# Patient Record
Sex: Female | Born: 1975 | Race: White | Hispanic: No | Marital: Married | Smoking: Former smoker
Health system: Southern US, Community
[De-identification: ages and names within clinical notes are randomized; demographics above are authoritative.]

## PROBLEM LIST (undated history)

## (undated) DIAGNOSIS — B351 Tinea unguium: Secondary | ICD-10-CM

## (undated) HISTORY — PX: OTHER SURGICAL HISTORY: SHX169

## (undated) HISTORY — PX: TUBAL LIGATION: SHX77

---

## 1998-10-30 ENCOUNTER — Other Ambulatory Visit: Admission: RE | Admit: 1998-10-30 | Discharge: 1998-10-30 | Payer: Self-pay | Admitting: Obstetrics and Gynecology

## 2001-09-28 ENCOUNTER — Other Ambulatory Visit: Admission: RE | Admit: 2001-09-28 | Discharge: 2001-09-28 | Payer: Self-pay

## 2003-09-28 ENCOUNTER — Emergency Department (HOSPITAL_COMMUNITY): Admission: EM | Admit: 2003-09-28 | Discharge: 2003-09-28 | Payer: Self-pay | Admitting: Emergency Medicine

## 2005-07-19 ENCOUNTER — Emergency Department (HOSPITAL_COMMUNITY): Admission: EM | Admit: 2005-07-19 | Discharge: 2005-07-19 | Payer: Self-pay | Admitting: Emergency Medicine

## 2012-09-24 ENCOUNTER — Emergency Department (HOSPITAL_COMMUNITY)
Admission: EM | Admit: 2012-09-24 | Discharge: 2012-09-24 | Disposition: A | Discharge status: Home / Self Care | Payer: BC Managed Care – PPO | Attending: Emergency Medicine | Admitting: Emergency Medicine

## 2012-09-24 ENCOUNTER — Encounter (HOSPITAL_COMMUNITY): Payer: Self-pay | Admitting: *Deleted

## 2012-09-24 DIAGNOSIS — R1310 Dysphagia, unspecified: Secondary | ICD-10-CM | POA: Insufficient documentation

## 2012-09-24 DIAGNOSIS — IMO0002 Reserved for concepts with insufficient information to code with codable children: Secondary | ICD-10-CM | POA: Insufficient documentation

## 2012-09-24 DIAGNOSIS — L509 Urticaria, unspecified: Secondary | ICD-10-CM | POA: Insufficient documentation

## 2012-09-24 DIAGNOSIS — Z880 Allergy status to penicillin: Secondary | ICD-10-CM | POA: Insufficient documentation

## 2012-09-24 DIAGNOSIS — R609 Edema, unspecified: Secondary | ICD-10-CM | POA: Insufficient documentation

## 2012-09-24 DIAGNOSIS — Z91040 Latex allergy status: Secondary | ICD-10-CM | POA: Insufficient documentation

## 2012-09-24 DIAGNOSIS — F17200 Nicotine dependence, unspecified, uncomplicated: Secondary | ICD-10-CM | POA: Insufficient documentation

## 2012-09-24 DIAGNOSIS — Z8619 Personal history of other infectious and parasitic diseases: Secondary | ICD-10-CM | POA: Insufficient documentation

## 2012-09-24 DIAGNOSIS — L299 Pruritus, unspecified: Secondary | ICD-10-CM | POA: Insufficient documentation

## 2012-09-24 HISTORY — DX: Tinea unguium: B35.1

## 2012-09-24 MED ORDER — DIPHENHYDRAMINE HCL 50 MG/ML IJ SOLN
25.0000 mg | Freq: Once | INTRAMUSCULAR | Status: AC
  Administered 2012-09-24: 25 mg via INTRAVENOUS
  Filled 2012-09-24: qty 1

## 2012-09-24 MED ORDER — FAMOTIDINE IN NACL 20-0.9 MG/50ML-% IV SOLN
20.0000 mg | Freq: Once | INTRAVENOUS | Status: AC
  Administered 2012-09-24: 20 mg via INTRAVENOUS
  Filled 2012-09-24: qty 50

## 2012-09-24 MED ORDER — EPINEPHRINE 0.3 MG/0.3ML IJ SOAJ: INTRAMUSCULAR | Status: DC

## 2012-09-24 MED ORDER — METHYLPREDNISOLONE SODIUM SUCC 125 MG IJ SOLR
125.0000 mg | Freq: Once | INTRAMUSCULAR | Status: AC
  Administered 2012-09-24: 125 mg via INTRAVENOUS
  Filled 2012-09-24: qty 2

## 2012-09-24 MED ORDER — HYDROXYZINE PAMOATE 25 MG PO CAPS: 25.0000 mg | ORAL_CAPSULE | Freq: Three times a day (TID) | ORAL | Status: DC | PRN

## 2012-09-24 MED ORDER — HYDROXYZINE HCL 25 MG PO TABS: ORAL_TABLET | ORAL | Status: DC

## 2012-09-24 NOTE — ED Notes (Signed)
Patient previously on Lamisil 2 weeks ago, began having problems w/nausea.  Called PMD who told her to take w/meal which helped but then began having whole body rash and swelling of face and feet.  PMD  Given Prednisone IM + PO rx and Cetirizine HCL.  Started on Prednisone PO but that night began having swelling and difficulty swallowing.  Yesterday went to [facility] ER and got IV Solumedrol which improved itching and swelling.  This AM awoke with new rash over arms and some facial swelling. Raised, red, puritic whelps on arms and hands.  No difficulty swallowing today.

## 2012-10-03 NOTE — ED Provider Notes (Signed)
CSN: [id number]     Arrival date & time 09/24/12  0912 History     First MD Initiated Contact with Patient 09/24/12 [phone]     Chief Complaint  Patient presents with  . Allergic Reaction  . Rash   (Consider location/radiation/quality/duration/timing/severity/associated sxs/prior Treatment) Patient is a 37 y.o. female presenting with allergic reaction and rash. The history is provided by the patient.  Allergic Reaction Presenting symptoms: difficulty swallowing, itching, rash and swelling   Presenting symptoms: no difficulty breathing and no wheezing   Severity:  Moderate Prior allergic episodes:  No prior episodes (latex allergies, penicillin allergies) Context: medications   Relieved by:  Nothing (Partial improvement with IV steroids.) Worsened by:  Nothing tried Ineffective treatments:  Antihistamines and steroids Rash Associated symptoms: no chest pain, no cough, no dysuria, no hematuria and no shortness of breath     Past Medical History  Diagnosis Date  . Nail fungus    Past Surgical History  Procedure Laterality Date  . Cesarean section      x 4  . Leap     . Dilation and cutterage    . Tubal ligation     History reviewed. No pertinent family history. History  Substance Use Topics  . Smoking status: Current Every Day Smoker -- 0.50 packs/day  . Smokeless tobacco: Not on file  . Alcohol Use: No   OB History   Grav Para Term Preterm Abortions TAB SAB Ect Mult Living                 Review of Systems  Constitutional: Negative for activity change.       All ROS Neg except as noted in HPI  HENT: Positive for trouble swallowing. Negative for nosebleeds and neck pain.   Eyes: Negative for photophobia and discharge.  Respiratory: Negative for cough, shortness of breath and wheezing.   Cardiovascular: Negative for chest pain and palpitations.  Gastrointestinal: Negative for abdominal pain and blood in stool.  Genitourinary: Negative for dysuria, frequency and  hematuria.  Musculoskeletal: Negative for back pain and arthralgias.  Skin: Positive for itching and rash.  Neurological: Negative for dizziness, seizures and speech difficulty.  Psychiatric/Behavioral: Negative for hallucinations and confusion.    Allergies  Penicillins; Lamisil; Codeine; and Latex  Home Medications   Current Outpatient Rx  Name  Route  Sig  Dispense  Refill  . cetirizine (ZYRTEC) 10 MG tablet   Oral   Take 10 mg by mouth daily.         . phentermine (ADIPEX-P) 37.5 MG tablet   Oral   Take 37.5 mg by mouth daily before breakfast.         . predniSONE (DELTASONE) 10 MG tablet   Oral   Take 10 mg by mouth as directed. Patient has the directions at home.         Marland Kitchen EPINEPHrine (EPI-PEN) 0.3 mg/0.3 mL SOAJ      Use for emergent allergic reactions and go to ED immediately.   1 Device   0   . hydrOXYzine (ATARAX/VISTARIL) 25 MG tablet      1 po qhs prn itching, or q6h  If needed.   20 tablet   0    BP 123/69  Pulse 68  Temp(Src) 98 F (36.7 C) (Oral)  Resp 18  Ht 5\' 3"  (1.6 m)  Wt 170 lb (77.111 kg)  BMI 30.12 kg/m2  SpO2 100%  LMP 07/25/2012 Physical Exam  Nursing note and vitals reviewed.  Constitutional: She is oriented to person, place, and time. She appears well-developed and well-nourished.  Non-toxic appearance.  HENT:  Head: Normocephalic.  Right Ear: Tympanic membrane and external ear normal.  Left Ear: Tympanic membrane and external ear normal.  Eyes: EOM and lids are normal. Pupils are equal, round, and reactive to light.  Neck: Normal range of motion. Neck supple. Carotid bruit is not present.  Cardiovascular: Normal rate, regular rhythm, normal heart sounds, intact distal pulses and normal pulses.   Pulmonary/Chest: Breath sounds normal. No respiratory distress.  Abdominal: Soft. Bowel sounds are normal. There is no tenderness. There is no guarding.  Musculoskeletal: Normal range of motion.  Lymphadenopathy:       Head (right  side): No submandibular adenopathy present.       Head (left side): No submandibular adenopathy present.    She has no cervical adenopathy.  Neurological: She is alert and oriented to person, place, and time. She has normal strength. No cranial nerve deficit or sensory deficit.  Skin: Skin is warm and dry.  Hives of various stages on the arms and hands and few on chest.  Psychiatric: She has a normal mood and affect. Her speech is normal.    ED Course   Procedures (including critical care time)  Labs Reviewed - No data to display No results found. 1. Hives     MDM  *I have reviewed nursing notes, vital signs, and all appropriate lab and imaging results for this patient.* Pt presents to ED with Hives. IV denadryl and solumedrol and pepcid given to the patient. Pt monitored on pulse ox/pulse monitors.  Pt rechecked after medications and found to have some of the hives gone, and some less red and angry. Airway remain patent during entire visit. No wheezes or stridor. Rx for vistaril and hydroxyzine given. Pt to continue the steroids. She will return immediately if any changes or problem.  Kathie Dike, PA-C 10/03/12 2121

## 2012-10-18 NOTE — ED Provider Notes (Signed)
Medical screening examination/treatment/procedure(s) were performed by non-physician practitioner and as supervising physician I was immediately available for consultation/collaboration.  Donnetta Hutching, MD 10/18/12 [phone]

## 2012-12-18 ENCOUNTER — Emergency Department (HOSPITAL_COMMUNITY)
Admission: EM | Admit: 2012-12-18 | Discharge: 2012-12-18 | Disposition: A | Discharge status: Home / Self Care | Payer: BC Managed Care – PPO | Attending: Emergency Medicine | Admitting: Emergency Medicine

## 2012-12-18 ENCOUNTER — Emergency Department (HOSPITAL_COMMUNITY): Payer: BC Managed Care – PPO

## 2012-12-18 ENCOUNTER — Encounter (HOSPITAL_COMMUNITY): Payer: Self-pay | Admitting: Emergency Medicine

## 2012-12-18 DIAGNOSIS — K8020 Calculus of gallbladder without cholecystitis without obstruction: Secondary | ICD-10-CM | POA: Insufficient documentation

## 2012-12-18 DIAGNOSIS — R112 Nausea with vomiting, unspecified: Secondary | ICD-10-CM | POA: Insufficient documentation

## 2012-12-18 DIAGNOSIS — Z91040 Latex allergy status: Secondary | ICD-10-CM | POA: Insufficient documentation

## 2012-12-18 DIAGNOSIS — K8050 Calculus of bile duct without cholangitis or cholecystitis without obstruction: Secondary | ICD-10-CM

## 2012-12-18 DIAGNOSIS — R5381 Other malaise: Secondary | ICD-10-CM | POA: Insufficient documentation

## 2012-12-18 DIAGNOSIS — F17200 Nicotine dependence, unspecified, uncomplicated: Secondary | ICD-10-CM | POA: Insufficient documentation

## 2012-12-18 DIAGNOSIS — Z880 Allergy status to penicillin: Secondary | ICD-10-CM | POA: Insufficient documentation

## 2012-12-18 DIAGNOSIS — Z3202 Encounter for pregnancy test, result negative: Secondary | ICD-10-CM | POA: Insufficient documentation

## 2012-12-18 DIAGNOSIS — Z79899 Other long term (current) drug therapy: Secondary | ICD-10-CM | POA: Insufficient documentation

## 2012-12-18 DIAGNOSIS — R61 Generalized hyperhidrosis: Secondary | ICD-10-CM | POA: Insufficient documentation

## 2012-12-18 DIAGNOSIS — Z8619 Personal history of other infectious and parasitic diseases: Secondary | ICD-10-CM | POA: Insufficient documentation

## 2012-12-18 DIAGNOSIS — R197 Diarrhea, unspecified: Secondary | ICD-10-CM | POA: Insufficient documentation

## 2012-12-18 LAB — CBC
HCT: 39.8 % (ref 36.0–46.0)
Hemoglobin: 13.4 g/dL (ref 12.0–15.0)
MCH: 27.9 pg (ref 26.0–34.0)
MCHC: 33.7 g/dL (ref 30.0–36.0)
MCV: 82.7 fL (ref 78.0–100.0)
Platelets: 290 10*3/uL (ref 150–400)
RBC: 4.81 MIL/uL (ref 3.87–5.11)
RDW: 13.7 % (ref 11.5–15.5)
WBC: 12.6 10*3/uL — ABNORMAL HIGH (ref 4.0–10.5)

## 2012-12-18 LAB — COMPREHENSIVE METABOLIC PANEL
ALT: 253 U/L — ABNORMAL HIGH (ref 0–35)
Alkaline Phosphatase: 121 U/L — ABNORMAL HIGH (ref 39–117)
Calcium: 10.1 mg/dL (ref 8.4–10.5)
GFR calc Af Amer: >90 mL/min (ref >90)
GFR calc non Af Amer: >90 mL/min (ref >90)
Glucose, Bld: 110 mg/dL — ABNORMAL HIGH (ref 70–99)
Total Protein: 7.9 g/dL (ref 6.0–8.3)

## 2012-12-18 LAB — URINE MICROSCOPIC-ADD ON

## 2012-12-18 LAB — LIPASE, BLOOD: Lipase: 33 U/L (ref 11–59)

## 2012-12-18 LAB — URINALYSIS, ROUTINE W REFLEX MICROSCOPIC: Urobilinogen, UA: 4 mg/dL — ABNORMAL HIGH (ref 0.0–1.0)

## 2012-12-18 LAB — POCT PREGNANCY, URINE: Preg Test, Ur: NEGATIVE

## 2012-12-18 MED ORDER — ONDANSETRON 8 MG PO TBDP: 8.0000 mg | ORAL_TABLET | Freq: Three times a day (TID) | ORAL | Status: DC | PRN

## 2012-12-18 MED ORDER — IOHEXOL 300 MG/ML  SOLN
50.0000 mL | Freq: Once | INTRAMUSCULAR | Status: AC | PRN
  Administered 2012-12-18: 50 mL via ORAL

## 2012-12-18 MED ORDER — ONDANSETRON HCL 4 MG/2ML IJ SOLN
4.0000 mg | Freq: Once | INTRAMUSCULAR | Status: AC
  Administered 2012-12-18: 4 mg via INTRAVENOUS
  Filled 2012-12-18: qty 2

## 2012-12-18 MED ORDER — IOHEXOL 300 MG/ML  SOLN
100.0000 mL | Freq: Once | INTRAMUSCULAR | Status: AC | PRN
  Administered 2012-12-18: 100 mL via INTRAVENOUS

## 2012-12-18 MED ORDER — SODIUM CHLORIDE 0.9 % IV BOLUS (SEPSIS)
1000.0000 mL | Freq: Once | INTRAVENOUS | Status: AC
  Administered 2012-12-18: 1000 mL via INTRAVENOUS

## 2012-12-18 NOTE — ED Provider Notes (Signed)
CSN: [id number]     Arrival date & time 12/18/12  1750 History   First MD Initiated Contact with Patient 12/18/12 1943     Chief Complaint  Patient presents with  . Abdominal Pain   (Consider location/radiation/quality/duration/timing/severity/associated sxs/prior Treatment) Patient is a 37 y.o. female presenting with abdominal pain. The history is provided by the patient.  Abdominal Pain Associated symptoms: chills, diarrhea, fatigue, nausea and vomiting   Associated symptoms: no chest pain, no dysuria and no shortness of breath    patient has had upper abdominal pain since last night. States she's had nausea vomiting diarrhea. She's had a decreased appetite. She states she has vomited what looked like poop. No fevers. She states she's had chills. She states she thinks it is her gallbladder. She states the pain is radiated up to her chest.  Past Medical History  Diagnosis Date  . Nail fungus    Past Surgical History  Procedure Laterality Date  . Cesarean section      x 4  . Leap     . Dilation and cutterage    . Tubal ligation     No family history on file. History  Substance Use Topics  . Smoking status: Current Every Day Smoker -- 0.50 packs/day  . Smokeless tobacco: Not on file  . Alcohol Use: No   OB History   Grav Para Term Preterm Abortions TAB SAB Ect Mult Living                 Review of Systems  Constitutional: Positive for chills, diaphoresis, appetite change and fatigue. Negative for activity change.  Eyes: Negative for pain.  Respiratory: Negative for chest tightness and shortness of breath.   Cardiovascular: Negative for chest pain and leg swelling.  Gastrointestinal: Positive for nausea, vomiting, abdominal pain and diarrhea.  Genitourinary: Negative for dysuria and flank pain.  Musculoskeletal: Negative for back pain and neck stiffness.  Skin: Negative for rash.  Neurological: Negative for weakness, numbness and headaches.  Psychiatric/Behavioral:  Negative for behavioral problems.    Allergies  Penicillins; Lamisil; Codeine; and Latex  Home Medications   Current Outpatient Rx  Name  Route  Sig  Dispense  Refill  . EPINEPHrine (EPI-PEN) 0.3 mg/0.3 mL SOAJ      Use for emergent allergic reactions and go to ED immediately.   1 Device   0   . ondansetron (ZOFRAN-ODT) 8 MG disintegrating tablet   Oral   Take 1 tablet (8 mg total) by mouth every 8 (eight) hours as needed for nausea.   10 tablet   0   . phentermine (ADIPEX-P) 37.5 MG tablet   Oral   Take 37.5 mg by mouth daily before breakfast.          BP 105/57  Pulse 81  Temp(Src) 98.5 F (36.9 C) (Oral)  Resp 22  Ht 5' 3.5" (1.613 m)  Wt 169 lb (76.658 kg)  BMI 29.46 kg/m2  SpO2 100% Physical Exam  Nursing note and vitals reviewed. Constitutional: She is oriented to person, place, and time. She appears well-developed and well-nourished.  HENT:  Head: Normocephalic and atraumatic.  Eyes: EOM are normal. Pupils are equal, round, and reactive to light.  Neck: Normal range of motion. Neck supple.  Cardiovascular: Normal rate, regular rhythm and normal heart sounds.   No murmur heard. Pulmonary/Chest: Effort normal and breath sounds normal. No respiratory distress. She has no wheezes. She has no rales.  Abdominal: Soft. Bowel sounds are normal. She  exhibits no distension. There is tenderness. There is no rebound and no guarding.  Mild upper abdominal tenderness without rebound or guarding.  Musculoskeletal: Normal range of motion.  Neurological: She is alert and oriented to person, place, and time. No cranial nerve deficit.  Skin: Skin is warm and dry.  Psychiatric: She has a normal mood and affect. Her speech is normal.    ED Course  Procedures (including critical care time) Labs Review Labs Reviewed  COMPREHENSIVE METABOLIC PANEL - Abnormal; Notable for the following:    Glucose, Bld 110 (*)    AST 308 (*)    ALT 253 (*)    Alkaline Phosphatase 121 (*)     Total Bilirubin 2.5 (*)    All other components within normal limits  CBC - Abnormal; Notable for the following:    WBC 12.6 (*)    All other components within normal limits  URINALYSIS, ROUTINE W REFLEX MICROSCOPIC - Abnormal; Notable for the following:    Color, Urine ORANGE (*)    APPearance CLOUDY (*)    Specific Gravity, Urine >1.030 (*)    Hgb urine dipstick SMALL (*)    Bilirubin Urine LARGE (*)    Ketones, ur TRACE (*)    Protein, ur 100 (*)    Urobilinogen, UA 4.0 (*)    Nitrite POSITIVE (*)    Leukocytes, UA TRACE (*)    All other components within normal limits  URINE MICROSCOPIC-ADD ON - Abnormal; Notable for the following:    Squamous Epithelial / LPF MANY (*)    Bacteria, UA MANY (*)    All other components within normal limits  URINE CULTURE  LIPASE, BLOOD  POCT PREGNANCY, URINE   Imaging Review Ct Abdomen Pelvis W Contrast  12/18/2012   *RADIOLOGY REPORT*  Clinical Data: Upper abdominal pain  CT ABDOMEN AND PELVIS WITH CONTRAST  Technique:  Multidetector CT imaging of the abdomen and pelvis was performed following the standard protocol during bolus administration of intravenous contrast.  Contrast: OMNIPAQUE IOHEXOL 300 MG/ML  SOLN, 50mL OMNIPAQUE IOHEXOL 300 MG/ML  SOLN  Comparison: None.  Findings: The visualized lung bases are clear.  The liver demonstrates a normal contrast enhanced appearance. Single calcified gallstone is present within the gallbladder lumen (series 2, image 27).  There is no CT evidence of acute cholecystitis.  The spleen, adrenal glands, and pancreas demonstrate normal contrast enhanced appearance.  The kidneys are symmetric in size with symmetric enhancement.  No nephrolithiasis, hydronephrosis, or focal renal masses identified.  The stomach is within normal limits.  No hiatal hernia.  No evidence of obstruction.  Appendix is well visualized and is normal in caliber without associate inflammatory changes to suggest acute appendicitis.   No abnormal wall thickening, enhancement, or perienteric fat stranding is seen about the bowels.  Fat containing periumbilical hernia is present.  The bladder is within normal limits.  A 1.3 cm hyperdense lesions seen within the left uterine body is present (series 2, image 76), most consistent with a fibroid. Uterus is otherwise unremarkable.  A 1.9 x 1.9 cm corpus luteal cyst is present within the right ovary.  There is a small amount of associated free fluid within the right pelvic cul-de-sac.  Left ovary is within normal limits.  No free air is identified.  No pathologically enlarged intra- abdominal pelvic lymph nodes.  The osseous structures are within normal limits.  IMPRESSION: 1.  Cholelithiasis without CT evidence of acute cholecystitis. 2.  1.3 cm hyperdense lesion within  the uterine body, likely a fibroid. This could be further evaluated with dedicated pelvic ultrasound and / or MRI as clinically indicated. 3.  Right corpus luteal cyst with associated small volume free fluid within the right pelvic cul-de-sac.  4.  Normal appendix. 5.  Small fat containing periumbilical hernia.   Original Report Authenticated By: Rise Mu, M.D.    EKG Interpretation   None       MDM   1. Biliary colic    Patient with nausea vomiting diarrhea. No dysuria. Laboratory was elevated LFTs. CT scan showed cholelithiasis without cholecystitis. Patient did have previous severe pain. It may have been an obstructing stone at that time said that since freed itself. Patient feels better. She will be discharged home. Urine is nonspecific. Culture has been sent. We'll follow with general surgery and PCP. Will likely need LFTs rechecked within a week    Juliet Rude. Rubin Payor, MD 12/18/12 2318

## 2012-12-18 NOTE — ED Notes (Signed)
Pt reporting generalized upper abdominal pain in epigastric area.  Reporting pain, chills, nausea and vomiting starting last night. Reports pain somewhat improved at present time, due to taking a percocet prior to arrival.

## 2012-12-18 NOTE — ED Notes (Signed)
Pt resting quietly.  Respirations regular, even and unlabored.  No distress noted.  

## 2012-12-18 NOTE — ED Notes (Signed)
Vomiting and diarrhea last night. Upper abdomen pain last night also. Last vomited at 0500. Pain continues to back and upper abdomen. NAD

## 2012-12-19 ENCOUNTER — Encounter (HOSPITAL_COMMUNITY): Payer: Self-pay | Admitting: Emergency Medicine

## 2012-12-19 ENCOUNTER — Inpatient Hospital Stay (HOSPITAL_COMMUNITY)
Admission: EM | Admit: 2012-12-19 | Discharge: 2012-12-21 | DRG: 494 | Disposition: A | Discharge status: Home / Self Care | Payer: BC Managed Care – PPO | Attending: General Surgery | Admitting: General Surgery

## 2012-12-19 DIAGNOSIS — K8010 Calculus of gallbladder with chronic cholecystitis without obstruction: Principal | ICD-10-CM | POA: Diagnosis present

## 2012-12-19 DIAGNOSIS — Z880 Allergy status to penicillin: Secondary | ICD-10-CM

## 2012-12-19 DIAGNOSIS — F17200 Nicotine dependence, unspecified, uncomplicated: Secondary | ICD-10-CM | POA: Diagnosis present

## 2012-12-19 DIAGNOSIS — Z885 Allergy status to narcotic agent status: Secondary | ICD-10-CM

## 2012-12-19 DIAGNOSIS — Z91040 Latex allergy status: Secondary | ICD-10-CM

## 2012-12-19 DIAGNOSIS — K8020 Calculus of gallbladder without cholecystitis without obstruction: Secondary | ICD-10-CM

## 2012-12-19 DIAGNOSIS — R17 Unspecified jaundice: Secondary | ICD-10-CM | POA: Diagnosis present

## 2012-12-19 LAB — URINALYSIS, ROUTINE W REFLEX MICROSCOPIC
Glucose, UA: NEGATIVE mg/dL
Ketones, ur: NEGATIVE mg/dL
Specific Gravity, Urine: 1.015 (ref 1.005–1.030)
Urobilinogen, UA: 0.2 mg/dL (ref 0.0–1.0)
pH: 7 (ref 5.0–8.0)

## 2012-12-19 LAB — URINE MICROSCOPIC-ADD ON

## 2012-12-19 LAB — COMPREHENSIVE METABOLIC PANEL
Albumin: 4 g/dL (ref 3.5–5.2)
CO2: 26 mEq/L (ref 19–32)
Creatinine, Ser: 0.76 mg/dL (ref 0.50–1.10)
Total Protein: 6.5 g/dL (ref 6.0–8.3)

## 2012-12-19 LAB — CBC WITH DIFFERENTIAL/PLATELET
Basophils Absolute: 0 10*3/uL (ref 0.0–0.1)
Basophils Relative: 0 % (ref 0–1)
Hemoglobin: 11.9 g/dL — ABNORMAL LOW (ref 12.0–15.0)
MCH: 27.5 pg (ref 26.0–34.0)
Monocytes Relative: 7 % (ref 3–12)
Neutro Abs: 5.8 10*3/uL (ref 1.7–7.7)
Platelets: 228 10*3/uL (ref 150–400)
RBC: 4.32 MIL/uL (ref 3.87–5.11)
RDW: 13.7 % (ref 11.5–15.5)

## 2012-12-19 LAB — BILIRUBIN, DIRECT: Bilirubin, Direct: 1.9 mg/dL — ABNORMAL HIGH (ref 0.0–0.3)

## 2012-12-19 MED ORDER — HYDROMORPHONE HCL PF 1 MG/ML IJ SOLN: 1.0000 mg | INTRAMUSCULAR | Status: DC | PRN

## 2012-12-19 MED ORDER — DIPHENHYDRAMINE HCL 50 MG/ML IJ SOLN
12.5000 mg | Freq: Four times a day (QID) | INTRAMUSCULAR | Status: DC | PRN
  Administered 2012-12-19: 25 mg via INTRAVENOUS
  Administered 2012-12-19: 12.5 mg via INTRAVENOUS
  Filled 2012-12-19 (×2): qty 1

## 2012-12-19 MED ORDER — DIPHENHYDRAMINE HCL 12.5 MG/5ML PO ELIX
12.5000 mg | ORAL_SOLUTION | Freq: Four times a day (QID) | ORAL | Status: DC | PRN
  Administered 2012-12-20: 25 mg via ORAL
  Administered 2012-12-21 (×2): 12.5 mg via ORAL
  Filled 2012-12-19: qty 5
  Filled 2012-12-19: qty 10
  Filled 2012-12-19: qty 5

## 2012-12-19 MED ORDER — ONDANSETRON HCL 4 MG/2ML IJ SOLN
4.0000 mg | Freq: Four times a day (QID) | INTRAMUSCULAR | Status: DC | PRN
  Administered 2012-12-19: 4 mg via INTRAVENOUS
  Filled 2012-12-19: qty 2

## 2012-12-19 MED ORDER — OXYCODONE HCL 5 MG PO TABS
5.0000 mg | ORAL_TABLET | ORAL | Status: DC | PRN
  Administered 2012-12-21: 5 mg via ORAL
  Filled 2012-12-19: qty 1

## 2012-12-19 MED ORDER — CLINDAMYCIN PHOSPHATE 600 MG/50ML IV SOLN
600.0000 mg | Freq: Three times a day (TID) | INTRAVENOUS | Status: DC
  Administered 2012-12-19 – 2012-12-21 (×6): 600 mg via INTRAVENOUS
  Filled 2012-12-19 (×10): qty 50

## 2012-12-19 MED ORDER — FENTANYL CITRATE 0.05 MG/ML IJ SOLN
INTRAMUSCULAR | Status: AC
  Filled 2012-12-19: qty 2

## 2012-12-19 MED ORDER — HYDROMORPHONE HCL PF 1 MG/ML IJ SOLN
1.0000 mg | Freq: Once | INTRAMUSCULAR | Status: AC
  Administered 2012-12-19: 1 mg via INTRAVENOUS
  Filled 2012-12-19: qty 1

## 2012-12-19 MED ORDER — ACETAMINOPHEN 325 MG PO TABS: 650.0000 mg | ORAL_TABLET | Freq: Four times a day (QID) | ORAL | Status: DC | PRN

## 2012-12-19 MED ORDER — PNEUMOCOCCAL VAC POLYVALENT 25 MCG/0.5ML IJ INJ
0.5000 mL | INJECTION | INTRAMUSCULAR | Status: AC
  Administered 2012-12-20: 0.5 mL via INTRAMUSCULAR
  Filled 2012-12-19: qty 0.5

## 2012-12-19 MED ORDER — NICOTINE 14 MG/24HR TD PT24
14.0000 mg | MEDICATED_PATCH | Freq: Every day | TRANSDERMAL | Status: DC
  Administered 2012-12-19: 14 mg via TRANSDERMAL
  Filled 2012-12-19 (×2): qty 1

## 2012-12-19 MED ORDER — KCL IN DEXTROSE-NACL 20-5-0.45 MEQ/L-%-% IV SOLN
INTRAVENOUS | Status: DC
  Administered 2012-12-19 – 2012-12-20 (×4): via INTRAVENOUS

## 2012-12-19 MED ORDER — ENOXAPARIN SODIUM 40 MG/0.4ML ~~LOC~~ SOLN
40.0000 mg | SUBCUTANEOUS | Status: DC
  Administered 2012-12-19 – 2012-12-20 (×2): 40 mg via SUBCUTANEOUS
  Filled 2012-12-19 (×2): qty 0.4

## 2012-12-19 MED ORDER — ONDANSETRON HCL 4 MG/2ML IJ SOLN
INTRAMUSCULAR | Status: AC
  Filled 2012-12-19: qty 2

## 2012-12-19 MED ORDER — ACETAMINOPHEN 650 MG RE SUPP: 650.0000 mg | Freq: Four times a day (QID) | RECTAL | Status: DC | PRN

## 2012-12-19 MED ORDER — INFLUENZA VAC SPLIT QUAD 0.5 ML IM SUSP
0.5000 mL | INTRAMUSCULAR | Status: AC
  Administered 2012-12-20: 0.5 mL via INTRAMUSCULAR
  Filled 2012-12-19: qty 0.5

## 2012-12-19 NOTE — ED Notes (Signed)
MD at bedside. Jenkins 

## 2012-12-19 NOTE — ED Provider Notes (Signed)
CSN: [id number]     Arrival date & time 12/19/12  0804 History  This chart was scribed for Karen Baton, MD by Caryn Bee, ED Scribe. This patient was seen in room [facility]10[facility] and the patient's care was started 8:19 AM.    Chief Complaint  Patient presents with  . Abdominal Pain   The history is provided by the patient. No language interpreter was used.   HPI Comments: Karen Gillespie is a 37 y.o. female who presents to the Emergency Department complaining of gradual onset, constant severe sharp/stabbing RUQ abdominal pain that began yesterday. She was seen in the ED yesterday by Dr. Benjiman Core and was diagnosed with gallstones. Pt rates her pain as a 10/10. She also states that the pain radiates to her back. Pt states that last night after discharge the pain improved, but has since worsened. Pt states that she took Vicodin at about 1:00 AM with no relief. Pt has not had a BM since yesterday. She states she has urinated today but not as frequently as yesterday Pt denies fever, chest pain, SOB currently, but did have symptoms yesterday. Pt has been on phentermine for 2 weeks. Pt is allergic to codeine, penicillin, Lamisil, and latex.     Past Medical History  Diagnosis Date  . Nail fungus     shingles   Past Surgical History  Procedure Laterality Date  . Cesarean section      x 4  . Leap     . Dilation and cutterage    . Tubal ligation     History reviewed. No pertinent family history. History  Substance Use Topics  . Smoking status: Current Every Day Smoker -- 0.50 packs/day  . Smokeless tobacco: Not on file  . Alcohol Use: No   OB History   Grav Para Term Preterm Abortions TAB SAB Ect Mult Living                 Review of Systems  Constitutional: Negative for fever.  Respiratory: Negative for cough, chest tightness and shortness of breath.   Cardiovascular: Negative for chest pain.  Gastrointestinal: Positive for abdominal pain (RUQ). Negative for nausea  and vomiting.  Genitourinary: Negative for dysuria.  Musculoskeletal: Positive for back pain (Radiating from abdominal pain).  Skin: Negative for wound.  Neurological: Negative for headaches.  Psychiatric/Behavioral: Negative for confusion.  All other systems reviewed and are negative.    Allergies  Penicillins; Lamisil; Codeine; and Latex  Home Medications   No current outpatient prescriptions on file. Triage Vitals: BP 131/81  Pulse 76  Temp(Src) 98 F (36.7 C) (Oral)  Resp 21  SpO2 99%  Physical Exam  Nursing note and vitals reviewed. Constitutional: She is oriented to person, place, and time. She appears well-developed and well-nourished.  Distressed, nontoxic. Tearful.   HENT:  Head: Normocephalic and atraumatic.  Eyes: Pupils are equal, round, and reactive to light.  Neck: Neck supple.  Cardiovascular: Normal rate, regular rhythm and normal heart sounds.   Pulmonary/Chest: Effort normal and breath sounds normal. No respiratory distress. She has no wheezes.  Abdominal: Soft. Bowel sounds are normal. She exhibits no distension. There is tenderness. There is no rebound and no guarding.  Mild tenderness to palpation of the RUQ.+ Murphy's signs.   Neurological: She is alert and oriented to person, place, and time.  Skin: Skin is warm and dry.  Psychiatric: She has a normal mood and affect.  Anxious.    ED Course  Procedures (including  critical care time) DIAGNOSTIC STUDIES: Oxygen Saturation is 99% on room air, normal by my interpretation.    COORDINATION OF CARE: 8:23 AM-Discussed treatment plan which includes blood work, hydromorphone, fentanyl, and zofran with pt at bedside and pt agreed to plan.   Medications  enoxaparin (LOVENOX) injection 40 mg (40 mg Subcutaneous Given 12/19/12 1352)  dextrose 5 % and 0.45 % NaCl with KCl 20 mEq/L infusion ( Intravenous New Bag/Given 12/19/12 1326)  acetaminophen (TYLENOL) tablet 650 mg (not administered)    Or   acetaminophen (TYLENOL) suppository 650 mg (not administered)  oxyCODONE (Oxy IR/ROXICODONE) immediate release tablet 5 mg (not administered)  HYDROmorphone (DILAUDID) injection 1 mg (not administered)  diphenhydrAMINE (BENADRYL) injection 12.5-25 mg (12.5 mg Intravenous Given 12/19/12 1605)    Or  diphenhydrAMINE (BENADRYL) 12.5 MG/5ML elixir 12.5-25 mg ( Oral See Alternative 12/19/12 1605)  ondansetron (ZOFRAN) injection 4 mg (4 mg Intravenous Given 12/19/12 1605)  nicotine (NICODERM CQ - dosed in mg/24 hours) patch 14 mg (14 mg Transdermal Patch Applied 12/19/12 1315)  clindamycin (CLEOCIN) IVPB 600 mg (600 mg Intravenous Given 12/19/12 1351)  influenza vac split quadrivalent PF (FLUARIX) injection 0.5 mL (not administered)  pneumococcal 23 valent vaccine (PNU-IMMUNE) injection 0.5 mL (not administered)  HYDROmorphone (DILAUDID) injection 1 mg (1 mg Intravenous Given 12/19/12 0842)    Labs Review Labs Reviewed  CBC WITH DIFFERENTIAL - Abnormal; Notable for the following:    Hemoglobin 11.9 (*)    HCT 35.9 (*)    All other components within normal limits  COMPREHENSIVE METABOLIC PANEL - Abnormal; Notable for the following:    Glucose, Bld 115 (*)    AST 138 (*)    ALT 189 (*)    Alkaline Phosphatase 123 (*)    Total Bilirubin 4.1 (*)    All other components within normal limits  URINALYSIS, ROUTINE W REFLEX MICROSCOPIC - Abnormal; Notable for the following:    Hgb urine dipstick TRACE (*)    Bilirubin Urine SMALL (*)    All other components within normal limits  BILIRUBIN, DIRECT - Abnormal; Notable for the following:    Bilirubin, Direct 1.9 (*)    All other components within normal limits  URINE MICROSCOPIC-ADD ON  BASIC METABOLIC PANEL  MAGNESIUM  PHOSPHORUS  CBC  HEPATIC FUNCTION PANEL   Imaging Review Ct Abdomen Pelvis W Contrast  12/18/2012   *RADIOLOGY REPORT*  Clinical Data: Upper abdominal pain  CT ABDOMEN AND PELVIS WITH CONTRAST  Technique:  Multidetector CT  imaging of the abdomen and pelvis was performed following the standard protocol during bolus administration of intravenous contrast.  Contrast: OMNIPAQUE IOHEXOL 300 MG/ML  SOLN, 50mL OMNIPAQUE IOHEXOL 300 MG/ML  SOLN  Comparison: None.  Findings: The visualized lung bases are clear.  The liver demonstrates a normal contrast enhanced appearance. Single calcified gallstone is present within the gallbladder lumen (series 2, image 27).  There is no CT evidence of acute cholecystitis.  The spleen, adrenal glands, and pancreas demonstrate normal contrast enhanced appearance.  The kidneys are symmetric in size with symmetric enhancement.  No nephrolithiasis, hydronephrosis, or focal renal masses identified.  The stomach is within normal limits.  No hiatal hernia.  No evidence of obstruction.  Appendix is well visualized and is normal in caliber without associate inflammatory changes to suggest acute appendicitis.  No abnormal wall thickening, enhancement, or perienteric fat stranding is seen about the bowels.  Fat containing periumbilical hernia is present.  The bladder is within normal limits.  A  1.3 cm hyperdense lesions seen within the left uterine body is present (series 2, image 76), most consistent with a fibroid. Uterus is otherwise unremarkable.  A 1.9 x 1.9 cm corpus luteal cyst is present within the right ovary.  There is a small amount of associated free fluid within the right pelvic cul-de-sac.  Left ovary is within normal limits.  No free air is identified.  No pathologically enlarged intra- abdominal pelvic lymph nodes.  The osseous structures are within normal limits.  IMPRESSION: 1.  Cholelithiasis without CT evidence of acute cholecystitis. 2.  1.3 cm hyperdense lesion within the uterine body, likely a fibroid. This could be further evaluated with dedicated pelvic ultrasound and / or MRI as clinically indicated. 3.  Right corpus luteal cyst with associated small volume free fluid within the right  pelvic cul-de-sac.  4.  Normal appendix. 5.  Small fat containing periumbilical hernia.   Original Report Authenticated By: Rise Mu, M.D.    EKG Interpretation     Ventricular Rate:    PR Interval:    QRS Duration:   QT Interval:    QTC Calculation:   R Axis:     Text Interpretation:              MDM   1. Cholelithiasis    Patient presents with RUQ pain.  Evaluated last night and found to have gallstones on CT.  Worsened pain at home.  Patient given dilaudid and zofran.  Repeat labwork shows normalization of all LFTs except bilirubin which is up to 4.  Patient's pain is currently controlled.  Spoke with surgery who will admit patient for concern of CBD stone and possible need for ERCP.   I personally performed the services described in this documentation, which was scribed in my presence. The recorded information has been reviewed and is accurate.   Karen Baton, MD 12/19/12 [phone]

## 2012-12-19 NOTE — H&P (Signed)
Karen Gillespie is an 37 y.o. female.   Chief Complaint: Abdominal pain HPI: Patient is a 37 year old otherwise healthy white female who presented to the emergency room yesterday evening with a three-day history of right upper quadrant abdominal pain and nausea. Her liver enzyme tests were noted to be elevated and she did have a CT scan of the abdomen which revealed cholelithiasis. She was discharged home on Vicodin. Her pain recurred while at home, though she came back to the emergency room this morning. Repeat liver enzyme test revealed her total bilirubin to be elevated at 4. Her other liver enzyme tests were starting to normalize. Her lipase was within normal limits. She states that this is her first episode. She denies any fever, chills, or jaundice, though her skin has become itchy. Her urine is orange.  Past Medical History  Diagnosis Date  . Nail fungus     Past Surgical History  Procedure Laterality Date  . Cesarean section      x 4  . Leap     . Dilation and cutterage    . Tubal ligation      History reviewed. No pertinent family history. Social History:  reports that she has been smoking.  She does not have any smokeless tobacco history on file. She reports that she does not drink alcohol or use illicit drugs.  Allergies:  Allergies  Allergen Reactions  . Penicillins Shortness Of Breath, Swelling and Rash  . Lamisil [Terbinafine Hcl] Nausea Only  . Codeine Swelling and Rash  . Latex Swelling and Rash     (Not in a hospital admission)  Results for orders placed during the hospital encounter of 12/19/12 (from the past 48 hour(s))  BILIRUBIN, DIRECT     Status: Abnormal   Collection Time    12/19/12  8:31 AM      Result Value Range   Bilirubin, Direct 1.9 (*) 0.0 - 0.3 mg/dL  CBC WITH DIFFERENTIAL     Status: Abnormal   Collection Time    12/19/12  8:34 AM      Result Value Range   WBC 8.0  4.0 - 10.5 K/uL   RBC 4.32  3.87 - 5.11 MIL/uL   Hemoglobin 11.9 (*)  12.0 - 15.0 g/dL   HCT 09.6 (*) 04.5 - 40.9 %   MCV 83.1  78.0 - 100.0 fL   MCH 27.5  26.0 - 34.0 pg   MCHC 33.1  30.0 - 36.0 g/dL   RDW 81.1  91.4 - 78.2 %   Platelets 228  150 - 400 K/uL   Neutrophils Relative % 73  43 - 77 %   Neutro Abs 5.8  1.7 - 7.7 K/uL   Lymphocytes Relative 18  12 - 46 %   Lymphs Abs 1.5  0.7 - 4.0 K/uL   Monocytes Relative 7  3 - 12 %   Monocytes Absolute 0.6  0.1 - 1.0 K/uL   Eosinophils Relative 2  0 - 5 %   Eosinophils Absolute 0.1  0.0 - 0.7 K/uL   Basophils Relative 0  0 - 1 %   Basophils Absolute 0.0  0.0 - 0.1 K/uL  COMPREHENSIVE METABOLIC PANEL     Status: Abnormal   Collection Time    12/19/12  8:34 AM      Result Value Range   Sodium 135  135 - 145 mEq/L   Potassium 3.6  3.5 - 5.1 mEq/L   Chloride 100  96 - 112 mEq/L  CO2 26  19 - 32 mEq/L   Glucose, Bld 115 (*) 70 - 99 mg/dL   BUN 8  6 - 23 mg/dL   Creatinine, Ser 9.14  0.50 - 1.10 mg/dL   Calcium 8.8  8.4 - 78.2 mg/dL   Total Protein 6.5  6.0 - 8.3 g/dL   Albumin 4.0  3.5 - 5.2 g/dL   AST 956 (*) 0 - 37 U/L   ALT 189 (*) 0 - 35 U/L   Alkaline Phosphatase 123 (*) 39 - 117 U/L   Total Bilirubin 4.1 (*) 0.3 - 1.2 mg/dL   GFR calc non Af Amer >90  >90 mL/min   GFR calc Af Amer >90  >90 mL/min   Comment: (NOTE)     The eGFR has been calculated using the CKD EPI equation.     This calculation has not been validated in all clinical situations.     eGFR's persistently <90 mL/min signify possible Chronic Kidney     Disease.  URINALYSIS, ROUTINE W REFLEX MICROSCOPIC     Status: Abnormal   Collection Time    12/19/12 10:28 AM      Result Value Range   Color, Urine YELLOW  YELLOW   APPearance CLEAR  CLEAR   Specific Gravity, Urine 1.015  1.005 - 1.030   pH 7.0  5.0 - 8.0   Glucose, UA NEGATIVE  NEGATIVE mg/dL   Hgb urine dipstick TRACE (*) NEGATIVE   Bilirubin Urine SMALL (*) NEGATIVE   Ketones, ur NEGATIVE  NEGATIVE mg/dL   Protein, ur NEGATIVE  NEGATIVE mg/dL   Urobilinogen, UA 0.2   0.0 - 1.0 mg/dL   Nitrite NEGATIVE  NEGATIVE   Leukocytes, UA NEGATIVE  NEGATIVE  URINE MICROSCOPIC-ADD ON     Status: None   Collection Time    12/19/12 10:28 AM      Result Value Range   Squamous Epithelial / LPF RARE  RARE   WBC, UA 0-2  <3 WBC/hpf   RBC / HPF 3-6  <3 RBC/hpf   Bacteria, UA RARE  RARE   Ct Abdomen Pelvis W Contrast  12/18/2012   *RADIOLOGY REPORT*  Clinical Data: Upper abdominal pain  CT ABDOMEN AND PELVIS WITH CONTRAST  Technique:  Multidetector CT imaging of the abdomen and pelvis was performed following the standard protocol during bolus administration of intravenous contrast.  Contrast: OMNIPAQUE IOHEXOL 300 MG/ML  SOLN, 50mL OMNIPAQUE IOHEXOL 300 MG/ML  SOLN  Comparison: None.  Findings: The visualized lung bases are clear.  The liver demonstrates a normal contrast enhanced appearance. Single calcified gallstone is present within the gallbladder lumen (series 2, image 27).  There is no CT evidence of acute cholecystitis.  The spleen, adrenal glands, and pancreas demonstrate normal contrast enhanced appearance.  The kidneys are symmetric in size with symmetric enhancement.  No nephrolithiasis, hydronephrosis, or focal renal masses identified.  The stomach is within normal limits.  No hiatal hernia.  No evidence of obstruction.  Appendix is well visualized and is normal in caliber without associate inflammatory changes to suggest acute appendicitis.  No abnormal wall thickening, enhancement, or perienteric fat stranding is seen about the bowels.  Fat containing periumbilical hernia is present.  The bladder is within normal limits.  A 1.3 cm hyperdense lesions seen within the left uterine body is present (series 2, image 76), most consistent with a fibroid. Uterus is otherwise unremarkable.  A 1.9 x 1.9 cm corpus luteal cyst is present within the right ovary.  There is a small amount of associated free fluid within the right pelvic cul-de-sac.  Left ovary is within normal  limits.  No free air is identified.  No pathologically enlarged intra- abdominal pelvic lymph nodes.  The osseous structures are within normal limits.  IMPRESSION: 1.  Cholelithiasis without CT evidence of acute cholecystitis. 2.  1.3 cm hyperdense lesion within the uterine body, likely a fibroid. This could be further evaluated with dedicated pelvic ultrasound and / or MRI as clinically indicated. 3.  Right corpus luteal cyst with associated small volume free fluid within the right pelvic cul-de-sac.  4.  Normal appendix. 5.  Small fat containing periumbilical hernia.   Original Report Authenticated By: Rise Mu, M.D.    Review of Systems  Constitutional: Positive for malaise/fatigue.  HENT: Negative.   Eyes: Negative.   Respiratory: Negative.   Cardiovascular: Negative.   Gastrointestinal: Positive for nausea and abdominal pain.  Genitourinary: Negative.   Musculoskeletal: Negative.   Skin: Positive for itching.  Neurological: Negative.     Blood pressure 131/81, pulse 76, temperature 98 F (36.7 C), temperature source Oral, resp. rate 21, SpO2 99.00%. Physical Exam  Constitutional: She is oriented to person, place, and time. She appears well-developed and well-nourished.  HENT:  Head: Normocephalic and atraumatic.  Neck: Normal range of motion. Neck supple.  Cardiovascular: Normal rate, regular rhythm and normal heart sounds.   Respiratory: Effort normal and breath sounds normal.  GI: Soft. Bowel sounds are normal. She exhibits no distension. There is no rebound and no guarding.  Mild tenderness in the right upper quadrant to deep palpation.  Neurological: She is alert and oriented to person, place, and time.  Skin: Skin is warm and dry.     Assessment/Plan Impression: Cholecystitis, cholelithiasis, jaundice. She does have cholelithiasis, though she may have a common bile duct stone or has recently passed a common bile duct stone. No clinical evidence of  pancreatitis. Plan: Patient admitted to the hospital for control of her pain and nausea. She was started on clindamycin. She was subsequently undergo an MRCP. Further management is pending those results. The treatment plan has been discussed with the patient, who agrees to the treatment plan. She will ultimately require a laparoscopic cholecystectomy prior to discharge.  Carlette Palmatier A 12/19/2012, 11:50 AM

## 2012-12-19 NOTE — ED Notes (Signed)
Pt here yesterday and dx with gallstones. Left at midnight and could not fill rx's. States around 330 this am started having severe pain to ruq, around side and into back. Pt crying and slightly tense due to pain. Denies n/v/d since last night.

## 2012-12-20 ENCOUNTER — Inpatient Hospital Stay (HOSPITAL_COMMUNITY): Payer: BC Managed Care – PPO

## 2012-12-20 LAB — BASIC METABOLIC PANEL
CO2: 26 mEq/L (ref 19–32)
Calcium: 9.1 mg/dL (ref 8.4–10.5)
Chloride: 105 mEq/L (ref 96–112)
Sodium: 139 mEq/L (ref 135–145)

## 2012-12-20 LAB — URINE CULTURE: Colony Count: 25000

## 2012-12-20 LAB — PHOSPHORUS: Phosphorus: 3 mg/dL (ref 2.3–4.6)

## 2012-12-20 LAB — SURGICAL PCR SCREEN: MRSA, PCR: NEGATIVE

## 2012-12-20 LAB — CBC
Hemoglobin: 11.2 g/dL — ABNORMAL LOW (ref 12.0–15.0)
MCHC: 32.8 g/dL (ref 30.0–36.0)
Platelets: 223 10*3/uL (ref 150–400)
RBC: 4.05 MIL/uL (ref 3.87–5.11)
WBC: 5.5 10*3/uL (ref 4.0–10.5)

## 2012-12-20 LAB — HEPATIC FUNCTION PANEL
AST: 70 U/L — ABNORMAL HIGH (ref 0–37)
Albumin: 3.5 g/dL (ref 3.5–5.2)
Alkaline Phosphatase: 118 U/L — ABNORMAL HIGH (ref 39–117)
Bilirubin, Direct: 2.3 mg/dL — ABNORMAL HIGH (ref 0.0–0.3)
Indirect Bilirubin: 1.7 mg/dL — ABNORMAL HIGH (ref 0.3–0.9)
Total Protein: 6.1 g/dL (ref 6.0–8.3)

## 2012-12-20 LAB — MAGNESIUM: Magnesium: 2.1 mg/dL (ref 1.5–2.5)

## 2012-12-20 MED ORDER — GADOBENATE DIMEGLUMINE 529 MG/ML IV SOLN
15.0000 mL | Freq: Once | INTRAVENOUS | Status: AC | PRN
  Administered 2012-12-20: 15 mL via INTRAVENOUS

## 2012-12-20 MED ORDER — CHLORHEXIDINE GLUCONATE 4 % EX LIQD
1.0000 "application " | Freq: Once | CUTANEOUS | Status: AC
  Administered 2012-12-20: 1 via TOPICAL
  Filled 2012-12-20: qty 15

## 2012-12-20 NOTE — Care Management Note (Signed)
    Page 1 of 1   12/20/2012     2:29:53 PM   CARE MANAGEMENT NOTE 12/20/2012  Patient:  Karen Gillespie, Karen Gillespie   Account Number:  [id number]  Date Initiated:  12/20/2012  Documentation initiated by:  Rosemary Holms  Subjective/Objective Assessment:   Pt admitted from home where she lives with her spouse and children. Mother and spouse at bedside. No HH DME needs anticipated.     Action/Plan:   Anticipated DC Date:  12/22/2012   Anticipated DC Plan:  HOME/SELF CARE      DC Planning Services  CM consult      Choice offered to / List presented to:             Status of service:  Completed, signed off Medicare Important Message given?   (If response is "NO", the following Medicare IM given date fields will be blank) Date Medicare IM given:   Date Additional Medicare IM given:    Discharge Disposition:    Per UR Regulation:    If discussed at Long Length of Stay Meetings, dates discussed:    Comments:  12/20/12 Rosemary Holms RN BSN CM

## 2012-12-20 NOTE — Progress Notes (Signed)
Subjective: No significant abdominal pain.  Objective: Vital signs in last 24 hours: Temp:  [97.8 F (36.6 C)-98.3 F (36.8 C)] 98 F (36.7 C) (10/13 1119) Pulse Rate:  [69-85] 69 (10/13 1119) Resp:  [18] 18 (10/13 1119) BP: (94-111)/(44-70) 111/70 mmHg (10/13 1119) SpO2:  [98 %-100 %] 100 % (10/13 1119) Last BM Date: 12/18/12  Intake/Output from previous day: 10/12 0701 - 10/13 0700 In: 2231.7 [P.O.:480; I.V.:1601.7; IV Piggyback:150] Out: -  Intake/Output this shift: Total I/O In: 480 [P.O.:480] Out: -   General appearance: alert, cooperative and no distress Resp: clear to auscultation bilaterally Cardio: regular rate and rhythm, S1, S2 normal, no murmur, click, rub or gallop GI: soft, non-tender; bowel sounds normal; no masses,  no organomegaly  Lab Results:   Recent Labs  12/19/12 0834 12/20/12 0532  WBC 8.0 5.5  HGB 11.9* 11.2*  HCT 35.9* 34.1*  PLT 228 223   BMET  Recent Labs  12/19/12 0834 12/20/12 0532  NA 135 139  K 3.6 4.2  CL 100 105  CO2 26 26  GLUCOSE 115* 110*  BUN 8 5*  CREATININE 0.76 0.79  CALCIUM 8.8 9.1   PT/INR No results found for this basename: LABPROT, INR,  in the last 72 hours  Studies/Results: Ct Abdomen Pelvis W Contrast  12/18/2012   *RADIOLOGY REPORT*  Clinical Data: Upper abdominal pain  CT ABDOMEN AND PELVIS WITH CONTRAST  Technique:  Multidetector CT imaging of the abdomen and pelvis was performed following the standard protocol during bolus administration of intravenous contrast.  Contrast: OMNIPAQUE IOHEXOL 300 MG/ML  SOLN, 50mL OMNIPAQUE IOHEXOL 300 MG/ML  SOLN  Comparison: None.  Findings: The visualized lung bases are clear.  The liver demonstrates a normal contrast enhanced appearance. Single calcified gallstone is present within the gallbladder lumen (series 2, image 27).  There is no CT evidence of acute cholecystitis.  The spleen, adrenal glands, and pancreas demonstrate normal contrast enhanced  appearance.  The kidneys are symmetric in size with symmetric enhancement.  No nephrolithiasis, hydronephrosis, or focal renal masses identified.  The stomach is within normal limits.  No hiatal hernia.  No evidence of obstruction.  Appendix is well visualized and is normal in caliber without associate inflammatory changes to suggest acute appendicitis.  No abnormal wall thickening, enhancement, or perienteric fat stranding is seen about the bowels.  Fat containing periumbilical hernia is present.  The bladder is within normal limits.  A 1.3 cm hyperdense lesions seen within the left uterine body is present (series 2, image 76), most consistent with a fibroid. Uterus is otherwise unremarkable.  A 1.9 x 1.9 cm corpus luteal cyst is present within the right ovary.  There is a small amount of associated free fluid within the right pelvic cul-de-sac.  Left ovary is within normal limits.  No free air is identified.  No pathologically enlarged intra- abdominal pelvic lymph nodes.  The osseous structures are within normal limits.  IMPRESSION: 1.  Cholelithiasis without CT evidence of acute cholecystitis. 2.  1.3 cm hyperdense lesion within the uterine body, likely a fibroid. This could be further evaluated with dedicated pelvic ultrasound and / or MRI as clinically indicated. 3.  Right corpus luteal cyst with associated small volume free fluid within the right pelvic cul-de-sac.  4.  Normal appendix. 5.  Small fat containing periumbilical hernia.   Original Report Authenticated By: Rise Mu, M.D.   Mr 3d Recon At Scanner  12/20/2012   CLINICAL DATA:  Upper abdominal pain  with nausea.  EXAM: MR ABDOMEN WO/W CM MRCP; MR 3D RECON AT SCANNER  TECHNIQUE: Multiplanar multisequence MR imaging of the abdomen was performed, including heavily T2-weighted images of the biliary and pancreatic ducts. Three-dimensional MR images were rendered by post processing of the original MR data.  CONTRAST:  15mL MULTIHANCE  GADOBENATE DIMEGLUMINE 529 MG/ML IV SOLN  COMPARISON:  CT scan from 12/18/2012  FINDINGS: No focal abnormality within the liver parenchyma. No intra or extrahepatic biliary duct dilatation. Multiple tiny layering gallstones are seen dependently within the gallbladder lumen (see image 26 of series 4). The stones measure in the 3-4 mm size range. 3 mm non dependent filling defects seen along the gallbladder wall may be related to buoyant stones or tiny gallbladder polyps. Extrahepatic common duct is 5 mm in diameter. The cystic duct is not dilated. The common duct and the cystic duct join in the region of the pancreatic head. Pancreatic ductal anatomy is normal. There is no pancreatic ductal dilatation.  The spleen, stomach, duodenum, pancreas, adrenal glands, and kidneys are normal. No abdominal aortic aneurysm. There is no free fluid or lymphadenopathy in the abdomen.  Visualized skeleton shows no abnormal marrow signal.  IMPRESSION: Cholelithiasis with several tiny buoyant stones or cholesterol polyps of the gallbladder. There is no intra or extrahepatic biliary duct dilatation. No evidence for choledocholithiasis.   Electronically Signed   By: Kennith Center M.D.   On: 12/20/2012 11:45   Mr Abd W/wo Cm/mrcp  12/20/2012   CLINICAL DATA:  Upper abdominal pain with nausea.  EXAM: MR ABDOMEN WO/W CM MRCP; MR 3D RECON AT SCANNER  TECHNIQUE: Multiplanar multisequence MR imaging of the abdomen was performed, including heavily T2-weighted images of the biliary and pancreatic ducts. Three-dimensional MR images were rendered by post processing of the original MR data.  CONTRAST:  15mL MULTIHANCE GADOBENATE DIMEGLUMINE 529 MG/ML IV SOLN  COMPARISON:  CT scan from 12/18/2012  FINDINGS: No focal abnormality within the liver parenchyma. No intra or extrahepatic biliary duct dilatation. Multiple tiny layering gallstones are seen dependently within the gallbladder lumen (see image 26 of series 4). The stones measure in the 3-4  mm size range. 3 mm non dependent filling defects seen along the gallbladder wall may be related to buoyant stones or tiny gallbladder polyps. Extrahepatic common duct is 5 mm in diameter. The cystic duct is not dilated. The common duct and the cystic duct join in the region of the pancreatic head. Pancreatic ductal anatomy is normal. There is no pancreatic ductal dilatation.  The spleen, stomach, duodenum, pancreas, adrenal glands, and kidneys are normal. No abdominal aortic aneurysm. There is no free fluid or lymphadenopathy in the abdomen.  Visualized skeleton shows no abnormal marrow signal.  IMPRESSION: Cholelithiasis with several tiny buoyant stones or cholesterol polyps of the gallbladder. There is no intra or extrahepatic biliary duct dilatation. No evidence for choledocholithiasis.   Electronically Signed   By: Kennith Center M.D.   On: 12/20/2012 11:45    Anti-infectives: Anti-infectives   Start     Dose/Rate Route Frequency Ordered Stop   12/19/12 1400  clindamycin (CLEOCIN) IVPB 600 mg     600 mg 100 mL/hr over 30 Minutes Intravenous 3 times per day 12/19/12 1310        Assessment/Plan: Impression: Cholecystitis, cholelithiasis, jaundice. MRCP did not reveal any choledocholithiasis. Interestingly, patient still has hyperbilirubinemia with a direct component.  We'll proceed with laparoscopic cholecystectomy with intraoperative cholangiograms tomorrow. The risks and benefits of the procedure including  bleeding, infection, hepatobiliary injury, and the possibility of an open procedure were fully explained to the patient, who gave informed consent.  LOS: 1 day    Karen Gillespie A 12/20/2012

## 2012-12-20 NOTE — Progress Notes (Signed)
Pt. Requested something to help with sleep. She wanted to try benadryl. One hour post benadryl she was still unable to sleep. Paged on call MD.

## 2012-12-20 NOTE — Progress Notes (Signed)
UR Chart Review Completed  

## 2012-12-21 ENCOUNTER — Encounter (HOSPITAL_COMMUNITY): Payer: Self-pay | Admitting: *Deleted

## 2012-12-21 ENCOUNTER — Encounter (HOSPITAL_COMMUNITY)
Admission: EM | Disposition: A | Discharge status: Home / Self Care | Payer: Self-pay | Source: Home / Self Care | Attending: General Surgery

## 2012-12-21 ENCOUNTER — Inpatient Hospital Stay (HOSPITAL_COMMUNITY): Payer: BC Managed Care – PPO | Admitting: Anesthesiology

## 2012-12-21 ENCOUNTER — Inpatient Hospital Stay (HOSPITAL_COMMUNITY): Payer: BC Managed Care – PPO

## 2012-12-21 ENCOUNTER — Encounter (HOSPITAL_COMMUNITY): Payer: BC Managed Care – PPO | Admitting: Anesthesiology

## 2012-12-21 HISTORY — PX: CHOLECYSTECTOMY: SHX55

## 2012-12-21 LAB — HEPATIC FUNCTION PANEL
ALT: 101 U/L — ABNORMAL HIGH (ref 0–35)
Alkaline Phosphatase: 132 U/L — ABNORMAL HIGH (ref 39–117)
Bilirubin, Direct: 1.4 mg/dL — ABNORMAL HIGH (ref 0.0–0.3)
Indirect Bilirubin: 1.5 mg/dL — ABNORMAL HIGH (ref 0.3–0.9)
Total Bilirubin: 2.9 mg/dL — ABNORMAL HIGH (ref 0.3–1.2)

## 2012-12-21 SURGERY — LAPAROSCOPIC CHOLECYSTECTOMY WITH INTRAOPERATIVE CHOLANGIOGRAM
Anesthesia: General | Site: Abdomen | Wound class: Contaminated

## 2012-12-21 MED ORDER — BUPIVACAINE HCL (PF) 0.5 % IJ SOLN
INTRAMUSCULAR | Status: AC
  Filled 2012-12-21: qty 30

## 2012-12-21 MED ORDER — DEXAMETHASONE SODIUM PHOSPHATE 4 MG/ML IJ SOLN
4.0000 mg | Freq: Once | INTRAMUSCULAR | Status: AC
  Administered 2012-12-21: 4 mg via INTRAVENOUS

## 2012-12-21 MED ORDER — FENTANYL CITRATE 0.05 MG/ML IJ SOLN
INTRAMUSCULAR | Status: AC
  Filled 2012-12-21: qty 5

## 2012-12-21 MED ORDER — FENTANYL CITRATE 0.05 MG/ML IJ SOLN
INTRAMUSCULAR | Status: DC | PRN
  Administered 2012-12-21 (×6): 50 ug via INTRAVENOUS

## 2012-12-21 MED ORDER — KETOROLAC TROMETHAMINE 30 MG/ML IJ SOLN
INTRAMUSCULAR | Status: AC
  Filled 2012-12-21: qty 1

## 2012-12-21 MED ORDER — MIDAZOLAM HCL 2 MG/2ML IJ SOLN
INTRAMUSCULAR | Status: AC
  Filled 2012-12-21: qty 2

## 2012-12-21 MED ORDER — ROCURONIUM BROMIDE 100 MG/10ML IV SOLN
INTRAVENOUS | Status: DC | PRN
  Administered 2012-12-21: 25 mg via INTRAVENOUS
  Administered 2012-12-21: 5 mg via INTRAVENOUS

## 2012-12-21 MED ORDER — LIDOCAINE HCL (PF) 1 % IJ SOLN
INTRAMUSCULAR | Status: AC
  Filled 2012-12-21: qty 5

## 2012-12-21 MED ORDER — ONDANSETRON HCL 4 MG/2ML IJ SOLN
4.0000 mg | Freq: Once | INTRAMUSCULAR | Status: AC
  Administered 2012-12-21: 4 mg via INTRAVENOUS

## 2012-12-21 MED ORDER — ROCURONIUM BROMIDE 50 MG/5ML IV SOLN
INTRAVENOUS | Status: AC
  Filled 2012-12-21: qty 1

## 2012-12-21 MED ORDER — ZOLPIDEM TARTRATE 5 MG PO TABS
5.0000 mg | ORAL_TABLET | Freq: Every evening | ORAL | Status: DC | PRN
  Administered 2012-12-21: 5 mg via ORAL
  Filled 2012-12-21: qty 1

## 2012-12-21 MED ORDER — 0.9 % SODIUM CHLORIDE (POUR BTL) OPTIME
TOPICAL | Status: DC | PRN
  Administered 2012-12-21: 1000 mL

## 2012-12-21 MED ORDER — PROPOFOL 10 MG/ML IV BOLUS
INTRAVENOUS | Status: DC | PRN
  Administered 2012-12-21: 150 mg via INTRAVENOUS
  Administered 2012-12-21: 20 mg via INTRAVENOUS

## 2012-12-21 MED ORDER — MIDAZOLAM HCL 2 MG/2ML IJ SOLN
1.0000 mg | INTRAMUSCULAR | Status: DC | PRN
  Administered 2012-12-21: 2 mg via INTRAVENOUS

## 2012-12-21 MED ORDER — LACTATED RINGERS IV SOLN: INTRAVENOUS | Status: DC

## 2012-12-21 MED ORDER — ONDANSETRON HCL 4 MG/2ML IJ SOLN
INTRAMUSCULAR | Status: AC
  Filled 2012-12-21: qty 2

## 2012-12-21 MED ORDER — BUPIVACAINE HCL 0.5 % IJ SOLN
INTRAMUSCULAR | Status: DC | PRN
  Administered 2012-12-21: 10 mL

## 2012-12-21 MED ORDER — NEOSTIGMINE METHYLSULFATE 1 MG/ML IJ SOLN
INTRAMUSCULAR | Status: DC | PRN
  Administered 2012-12-21: 2 mg via INTRAVENOUS

## 2012-12-21 MED ORDER — GLYCOPYRROLATE 0.2 MG/ML IJ SOLN
INTRAMUSCULAR | Status: AC
  Filled 2012-12-21: qty 2

## 2012-12-21 MED ORDER — IOHEXOL 350 MG/ML SOLN
INTRAVENOUS | Status: DC | PRN
  Administered 2012-12-21: 50 mL

## 2012-12-21 MED ORDER — LIDOCAINE HCL (CARDIAC) 10 MG/ML IV SOLN
INTRAVENOUS | Status: DC | PRN
  Administered 2012-12-21: 20 mg via INTRAVENOUS

## 2012-12-21 MED ORDER — KETOROLAC TROMETHAMINE 30 MG/ML IJ SOLN
30.0000 mg | Freq: Once | INTRAMUSCULAR | Status: AC
  Administered 2012-12-21: 30 mg via INTRAVENOUS

## 2012-12-21 MED ORDER — DEXAMETHASONE SODIUM PHOSPHATE 4 MG/ML IJ SOLN
INTRAMUSCULAR | Status: AC
  Filled 2012-12-21: qty 1

## 2012-12-21 MED ORDER — FENTANYL CITRATE 0.05 MG/ML IJ SOLN
25.0000 ug | INTRAMUSCULAR | Status: DC | PRN
  Administered 2012-12-21 (×2): 50 ug via INTRAVENOUS
  Filled 2012-12-21: qty 2

## 2012-12-21 MED ORDER — HEMOSTATIC AGENTS (NO CHARGE) OPTIME
TOPICAL | Status: DC | PRN
  Administered 2012-12-21: 1 via TOPICAL

## 2012-12-21 MED ORDER — ONDANSETRON HCL 4 MG/2ML IJ SOLN: 4.0000 mg | Freq: Once | INTRAMUSCULAR | Status: DC | PRN

## 2012-12-21 MED ORDER — PROPOFOL 10 MG/ML IV BOLUS
INTRAVENOUS | Status: AC
  Filled 2012-12-21: qty 20

## 2012-12-21 MED ORDER — LACTATED RINGERS IV SOLN
INTRAVENOUS | Status: DC
  Administered 2012-12-21: 10:00:00 via INTRAVENOUS

## 2012-12-21 MED ORDER — GLYCOPYRROLATE 0.2 MG/ML IJ SOLN
INTRAMUSCULAR | Status: DC | PRN
  Administered 2012-12-21: 0.4 mg via INTRAVENOUS

## 2012-12-21 MED ORDER — OXYCODONE-ACETAMINOPHEN 7.5-325 MG PO TABS: 1.0000 | ORAL_TABLET | ORAL | Status: DC | PRN

## 2012-12-21 MED ORDER — FENTANYL CITRATE 0.05 MG/ML IJ SOLN
25.0000 ug | INTRAMUSCULAR | Status: AC
  Administered 2012-12-21 (×2): 25 ug via INTRAVENOUS

## 2012-12-21 MED ORDER — FENTANYL CITRATE 0.05 MG/ML IJ SOLN
INTRAMUSCULAR | Status: AC
  Filled 2012-12-21: qty 2

## 2012-12-21 SURGICAL SUPPLY — 50 items
APPLIER CLIP LAPSCP 10X32 DD (CLIP) ×2 IMPLANT
BAG HAMPER (MISCELLANEOUS) ×2 IMPLANT
BAG SPEC RTRVL LRG 6X4 10 (ENDOMECHANICALS) ×1
CATH CHOLANGIOGRAM 4.5FR (CATHETERS) ×2 IMPLANT
CLOTH BEACON ORANGE TIMEOUT ST (SAFETY) ×2 IMPLANT
COVER LIGHT HANDLE STERIS (MISCELLANEOUS) ×4 IMPLANT
COVER MAYO STAND XLG (DRAPE) ×2 IMPLANT
DECANTER SPIKE VIAL GLASS SM (MISCELLANEOUS) ×2 IMPLANT
DISSECTOR BLUNT TIP ENDO 5MM (MISCELLANEOUS) IMPLANT
DRAPE C-ARM FOLDED MOBILE STRL (DRAPES) ×2 IMPLANT
DURAPREP 26ML APPLICATOR (WOUND CARE) ×2 IMPLANT
ELECT REM PT RETURN 9FT ADLT (ELECTROSURGICAL) ×2
ELECTRODE REM PT RTRN 9FT ADLT (ELECTROSURGICAL) ×1 IMPLANT
FILTER SMOKE EVAC LAPAROSHD (FILTER) ×2 IMPLANT
FORMALIN 10 PREFIL 120ML (MISCELLANEOUS) ×2 IMPLANT
GLOVE BIO SURGEON STRL SZ7.5 (GLOVE) ×2 IMPLANT
GLOVE BIOGEL PI IND STRL 7.0 (GLOVE) IMPLANT
GLOVE BIOGEL PI IND STRL 7.5 (GLOVE) IMPLANT
GLOVE BIOGEL PI INDICATOR 7.0 (GLOVE) ×1
GLOVE BIOGEL PI INDICATOR 7.5 (GLOVE) ×1
GLOVE SKINSENSE NS SZ7.0 (GLOVE) ×1
GLOVE SKINSENSE NS SZ7.5 (GLOVE) ×2
GLOVE SKINSENSE STRL SZ7.0 (GLOVE) IMPLANT
GLOVE SKINSENSE STRL SZ7.5 (GLOVE) IMPLANT
GOWN STRL REIN XL XLG (GOWN DISPOSABLE) ×6 IMPLANT
HEMOSTAT SNOW SURGICEL 2X4 (HEMOSTASIS) ×3 IMPLANT
INST SET LAPROSCOPIC AP (KITS) ×2 IMPLANT
KIT ROOM TURNOVER APOR (KITS) ×2 IMPLANT
MANIFOLD NEPTUNE II (INSTRUMENTS) ×2 IMPLANT
NDL INSUFFLATION 14GA 120MM (NEEDLE) IMPLANT
NEEDLE INSUFFLATION 14GA 120MM (NEEDLE) ×2 IMPLANT
NS IRRIG 1000ML POUR BTL (IV SOLUTION) ×2 IMPLANT
PACK LAP CHOLE LZT030E (CUSTOM PROCEDURE TRAY) ×2 IMPLANT
PAD ARMBOARD 7.5X6 YLW CONV (MISCELLANEOUS) ×2 IMPLANT
POUCH SPECIMEN RETRIEVAL 10MM (ENDOMECHANICALS) ×2 IMPLANT
SET BASIN LINEN APH (SET/KITS/TRAYS/PACK) ×2 IMPLANT
SET TUBE IRRIG SUCTION NO TIP (IRRIGATION / IRRIGATOR) IMPLANT
SLEEVE ENDOPATH XCEL 5M (ENDOMECHANICALS) ×2 IMPLANT
SPONGE GAUZE 2X2 8PLY STRL LF (GAUZE/BANDAGES/DRESSINGS) ×8 IMPLANT
STAPLER VISISTAT (STAPLE) ×2 IMPLANT
SUT VICRYL 0 UR6 27IN ABS (SUTURE) ×2 IMPLANT
SYR 20CC LL (SYRINGE) ×2 IMPLANT
SYR 30ML LL (SYRINGE) ×2 IMPLANT
TAPE CLOTH SURG 4X10 WHT LF (GAUZE/BANDAGES/DRESSINGS) ×1 IMPLANT
TROCAR ENDO BLADELESS 11MM (ENDOMECHANICALS) ×2 IMPLANT
TROCAR XCEL NON-BLD 5MMX100MML (ENDOMECHANICALS) ×2 IMPLANT
TROCAR XCEL UNIV SLVE 11M 100M (ENDOMECHANICALS) ×2 IMPLANT
TUBING INSUFFLATION (TUBING) ×2 IMPLANT
WARMER LAPAROSCOPE (MISCELLANEOUS) ×2 IMPLANT
YANKAUER SUCT 12FT TUBE ARGYLE (SUCTIONS) ×2 IMPLANT

## 2012-12-21 NOTE — Anesthesia Postprocedure Evaluation (Signed)
Anesthesia Post Note  Patient: Karen Gillespie  Procedure(s) Performed: Procedure(s) (LRB): LAPAROSCOPIC CHOLECYSTECTOMY WITH INTRAOPERATIVE CHOLANGIOGRAM (N/A)  Anesthesia type: General  Patient location: PACU  Post pain: Pain level controlled  Post assessment: Post-op Vital signs reviewed, Patient's Cardiovascular Status Stable, Respiratory Function Stable, Patent Airway, No signs of Nausea or vomiting and Pain level controlled  Last Vitals:  Filed Vitals:   12/21/12 1105  BP: 110/57  Pulse: 73  Temp: 36.8 C  Resp: 20    Post vital signs: Reviewed and stable  Level of consciousness: awake and alert   Complications: No apparent anesthesia complications

## 2012-12-21 NOTE — Progress Notes (Signed)
Pt ambulated in hallway with standby assistance from significant other. Tolerated well. No C/O SOB. Pt ambulated approximately 250 feet.

## 2012-12-21 NOTE — Progress Notes (Signed)
Pt OOB, ambulating in room. Ambulated approximately 20 feet. Tolerated well. No c/o SOB.

## 2012-12-21 NOTE — Anesthesia Preprocedure Evaluation (Signed)
Anesthesia Evaluation  Patient identified by MRN, date of birth, ID band Patient awake    Reviewed: Allergy & Precautions, H&P , NPO status , Patient's Chart, lab work & pertinent test results  Airway Mallampati: I  TM Distance: >3 FB     Dental  (+) Teeth Intact   Pulmonary neg pulmonary ROS,  breath sounds clear to auscultation        Cardiovascular negative cardio ROS  Rhythm:Regular Rate:Normal     Neuro/Psych    GI/Hepatic negative GI ROS,   Endo/Other    Renal/GU      Musculoskeletal   Abdominal   Peds  Hematology   Anesthesia Other Findings   Reproductive/Obstetrics                             Anesthesia Physical Anesthesia Plan  ASA: I  Anesthesia Plan: General   Post-op Pain Management:    Induction: Intravenous  Airway Management Planned: Oral ETT  Additional Equipment:   Intra-op Plan:   Post-operative Plan: Extubation in OR  Informed Consent: I have reviewed the patients History and Physical, chart, labs and discussed the procedure including the risks, benefits and alternatives for the proposed anesthesia with the patient or authorized representative who has indicated his/her understanding and acceptance.     Plan Discussed with:   Anesthesia Plan Comments:         Anesthesia Quick Evaluation  

## 2012-12-21 NOTE — Transfer of Care (Signed)
Immediate Anesthesia Transfer of Care Note  Patient: Karen Gillespie  Procedure(s) Performed: Procedure(s) (LRB): LAPAROSCOPIC CHOLECYSTECTOMY WITH INTRAOPERATIVE CHOLANGIOGRAM (N/A)  Patient Location: PACU  Anesthesia Type: General  Level of Consciousness: awake  Airway & Oxygen Therapy: Patient Spontanous Breathing and non-rebreather face mask  Post-op Assessment: Report given to PACU RN, Post -op Vital signs reviewed and stable and Patient moving all extremities  Post vital signs: Reviewed and stable  Complications: No apparent anesthesia complications

## 2012-12-21 NOTE — Progress Notes (Signed)
Pt discharged home today per Dr. Lovell Sheehan. Pt's IV site D/C'd and WNL. Pt's VS stable at this time. Pt provided with home medication list, discharge instructions, and prescriptions. Verbalized understanding. Pt left floor via WC in stable condition accompanied by NT.

## 2012-12-21 NOTE — Op Note (Signed)
Patient:  Karen Gillespie  DOB:  1975-05-05  MRN:  [id number]   Preop Diagnosis:  Cholecystitis, cholelithiasis, jaundice  Postop Diagnosis:  Same  Procedure:  Laparoscopic cholecystectomy with intraoperative cholangiograms  Surgeon:  Franky Macho, M.D.  Anes:  General endotracheal  Indications:  Patient is a 37 year old white female who presented to emergency room with worsening upper abdominal pain, nausea, and jaundice. Ultrasound of gallbladder revealed cholelithiasis and cholecystitis. MRCP was done which revealed no choledocholithiasis. Her hyperbilirubinemia is decreasing and the patient now presents for laparoscopic cholecystectomy with intraoperative cholangiograms. The risks and benefits of the procedure including bleeding, infection, hepatobiliary injury, and the possibility of an open procedure were fully explained to the patient, who gave informed consent.  Procedure note:  The patient is placed the supine position. After induction of general endotracheal anesthesia, the abdomen was prepped and draped using usual sterile technique with DuraPrep. Surgical site confirmation was performed.  A supraumbilical incision was made down to the fascia. A Veress needle was introduced into and confirmation of placement was done using the saline drop test. The abdomen was then insufflated to 16 mm mercury pressure. An 11 mm trocar was introduced into the abdominal cavity under direct visualization without difficulty. The patient is placed in reverse Trendelenburg position and additional 11 mm trocar was placed the epigastric region and 5 mm trochars were placed the right upper quadrant and right flank regions. The liver was inspected and noted within normal limits. The gallbladder was retracted in a dynamic fashion in order to expose the triangle of Calot. A single clip was placed proximally on the cystic duct. An incision was made into the cystic duct and a cholangiocatheter was inserted. Under  digital fluoroscopy, a cholangiogram was performed. The dye fluid freely into the duodenum. The patient had a long cystic duct with a low insertion into the common bile duct. No filling defects were noted. The system was flushed with saline and the cholangiocatheter removed. Multiple clips were placed distally on the cystic duct and cystic duct was divided. The cystic artery was likewise ligated and divided. The gallbladder was freed away from the gallbladder fossa using Bovie electrocautery. The gallbladder was delivered through the epigastric trocar site using an Endo Catch bag. The gallbladder fossa was inspected and no abnormal bleeding or bile leakage was noted. Surgicel was placed in the gallbladder fossa. All fluid and air were then evacuated from the abdominal cavity prior to removal of the trochars.  All wounds were irrigated with normal saline. All wounds were injected with 0.5% Sensorcaine. Theic trocar site using an Endo Catch bag. The gallbladder fossa was inspected and no abnormal bleeding or bile leakage was noted. Surgicel is placed the gallbladder fossa. The epigastric fascia as well as supra umbilical fascia were reapproximated using 0 Vicryl interrupted sutures. All skin incisions were closed using staples. Betadine ointment and dry sterile dressings were applied.  All tape and needle counts were correct at the end of the procedure. Patient was extubated in the operating room and transferred to PACU in stable condition.   Complications:  none  EBL:  none  Specimen:  gallbladder

## 2012-12-21 NOTE — Anesthesia Procedure Notes (Signed)
Procedure Name: Intubation Date/Time: 12/21/2012 10:12 AM Performed by: Franco Nones Pre-anesthesia Checklist: Patient identified, Patient being monitored, Timeout performed, Emergency Drugs available and Suction available Patient Re-evaluated:Patient Re-evaluated prior to inductionOxygen Delivery Method: Circle System Utilized Preoxygenation: Pre-oxygenation with 100% oxygen Intubation Type: IV induction Ventilation: Mask ventilation without difficulty Laryngoscope Size: Miller and 3 Grade View: Grade I Tube type: Oral Tube size: 7.0 mm Number of attempts: 1 Airway Equipment and Method: stylet Placement Confirmation: ETT inserted through vocal cords under direct vision,  positive ETCO2 and breath sounds checked- equal and bilateral Secured at: 21 cm Tube secured with: Tape Dental Injury: Teeth and Oropharynx as per pre-operative assessment

## 2012-12-22 ENCOUNTER — Encounter (HOSPITAL_COMMUNITY): Payer: Self-pay | Admitting: General Surgery

## 2012-12-24 NOTE — Discharge Summary (Signed)
Physician Discharge Summary  Patient ID: Karen Gillespie MRN: [id number] DOB/AGE: April 07, 1975 37 y.o.  Admit date: 12/19/2012 Discharge date: 12/21/2012 Admission Diagnoses: Cholecystitis, cholelithiasis, jaundice  Discharge Diagnoses: Same Active Problems:   * No active hospital problems. *   Discharged Condition: good  Hospital Course: Patient is a 37 year old white female presented emergency room with right upper quadrant abdominal pain, nausea, and jaundice. CT scan of the abdomen revealed cholelithiasis. She was also noted to have hyperbilirubinemia. She was admitted to the hospital for further evaluation treatment. The following day, and MRCP was performed which was negative for choledocholithiasis. She still had elevated direct and total bilirubin levels. She subsequently went to the operating room on 12/21/2012 and underwent a laparoscopic cholecystectomy with intraoperative cholangiograms. No choledocholithiasis was seen. She tolerated the procedure well. Her postoperative course was unremarkable. Her diet was advanced at difficulty. She was discharged home later that day in good improving condition.  Significant Diagnostic Studies: MRCP on 12/20/2012  Treatments: surgery: Laparoscopic cholecystectomy with intraoperative cholangiograms on 12/21/2012  Discharge Exam: Blood pressure 114/66, pulse 78, temperature 98.2 F (36.8 C), temperature source Oral, resp. rate 14, height 5' 3.5" (1.613 m), weight 79.379 kg (175 lb), SpO2 96.00%. General appearance: alert, cooperative and no distress Resp: clear to auscultation bilaterally Cardio: regular rate and rhythm, S1, S2 normal, no murmur, click, rub or gallop GI: Soft. Incisions healing well.  Disposition: 01-Home or Self Care     Medication List    STOP taking these medications       HYDROcodone-acetaminophen 5-325 MG per tablet  Commonly known as:  NORCO/VICODIN      TAKE these medications       EPINEPHrine 0.3  mg/0.3 mL Soaj injection  Commonly known as:  EPI-PEN  Use for emergent allergic reactions and go to ED immediately.     ondansetron 8 MG disintegrating tablet  Commonly known as:  ZOFRAN-ODT  Take 1 tablet (8 mg total) by mouth every 8 (eight) hours as needed for nausea.     oxyCODONE-acetaminophen 7.5-325 MG per tablet  Commonly known as:  PERCOCET  Take 1-2 tablets by mouth every 4 (four) hours as needed.     phentermine 37.5 MG tablet  Commonly known as:  ADIPEX-P  Take 37.5 mg by mouth daily before breakfast.           Follow-up Information   Follow up with Dalia Heading, MD. Schedule an appointment as soon as possible for a visit on 12/30/2012.   Specialty:  General Surgery   Contact information:   [address] [place] [address] [phone]       Signed: Franky Macho A 12/24/2012, 9:53 AM

## 2014-04-06 IMAGING — RF DG CHOLANGIOGRAM OPERATIVE
1 series · 8 of 8 positions shown · non-contrast
Comparison: MRCP December 20, 2012

CLINICAL DATA: Cholelithiasis
TECHNIQUE: Cholangiographic images from the C-arm fluoroscopic device were
submitted for interpretation post-operatively. Please see the
procedural report for the amount of contrast and the fluoroscopy
time utilized.

[Series 1: run · 2 acquisitions, 8 frames shown]
[im 1/2]
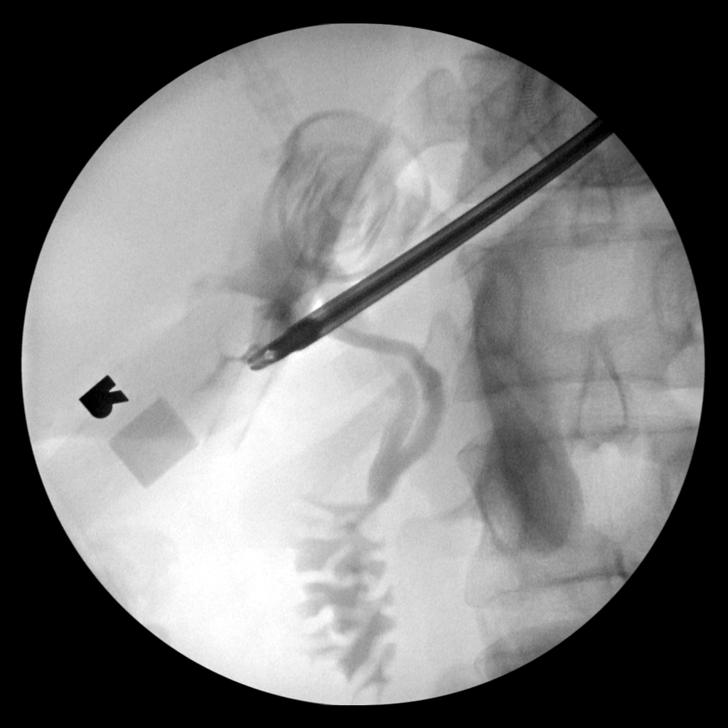
[im 1/2]
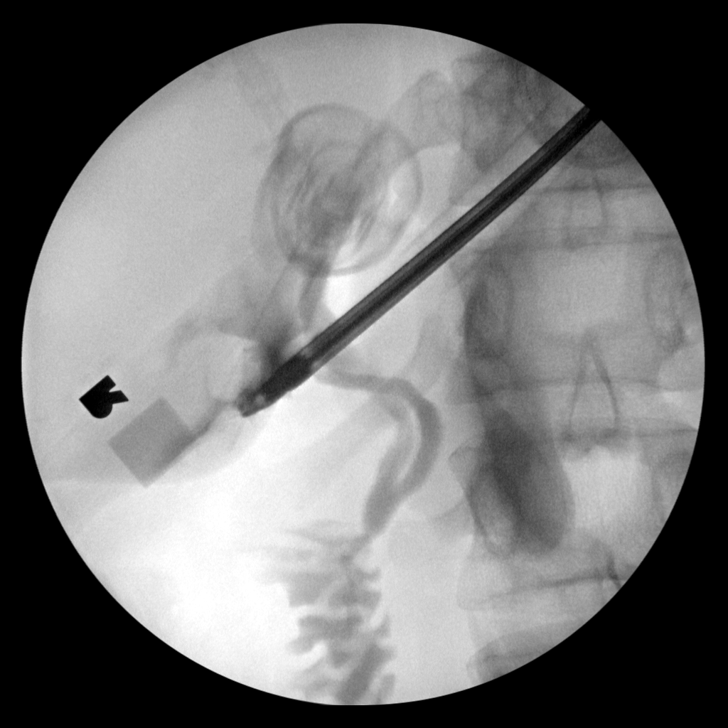
[im 1/2]
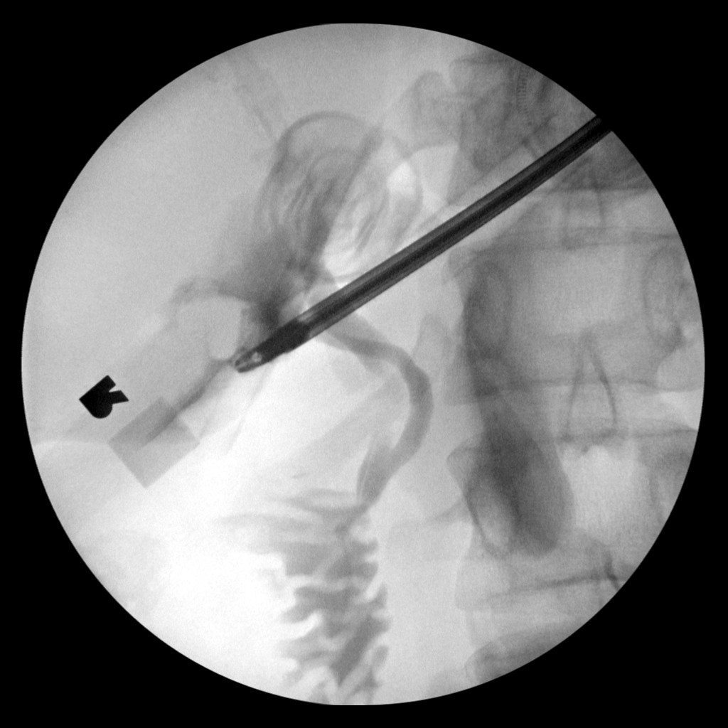
[im 1/2]
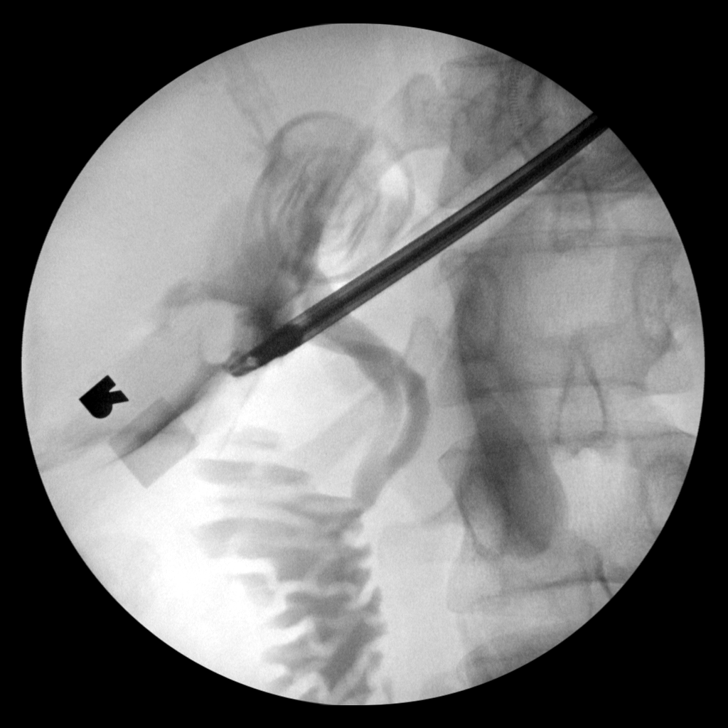
[im 2/2]
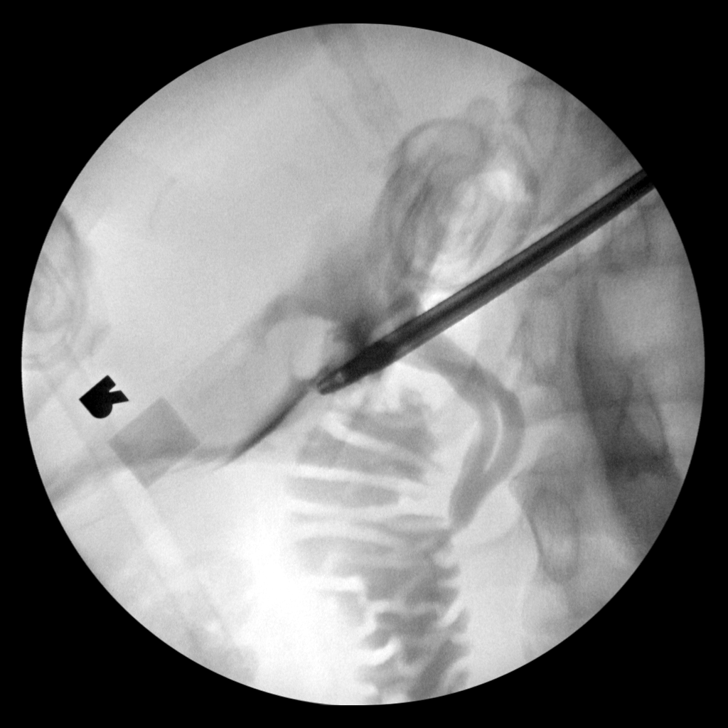
[im 2/2]
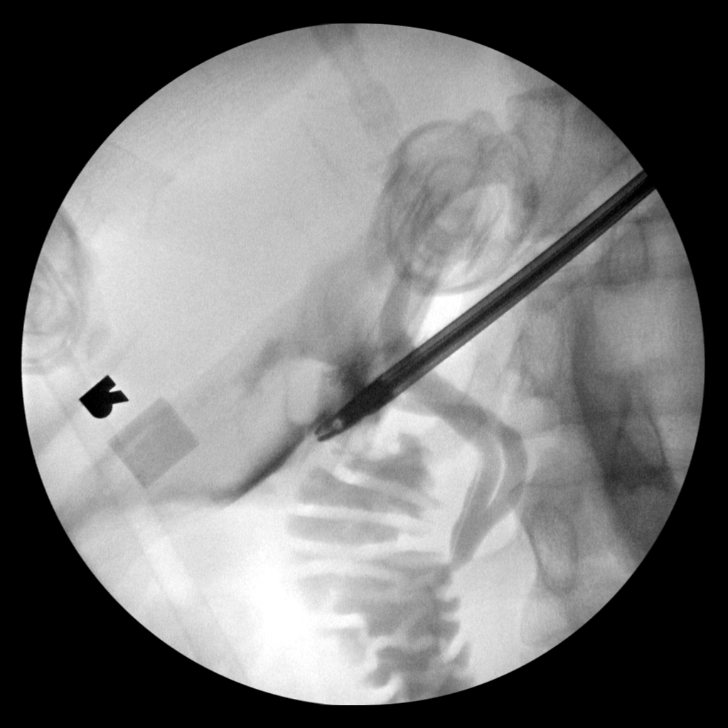
[im 2/2]
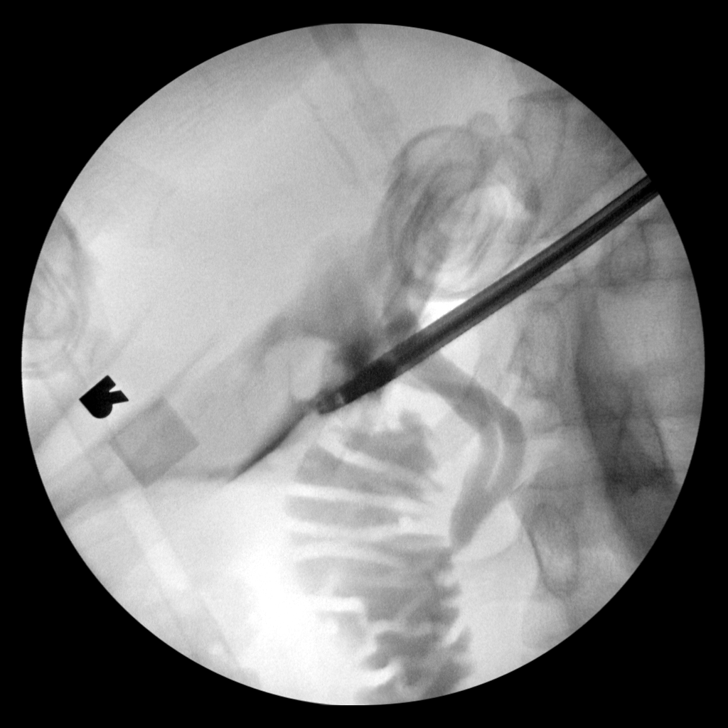
[im 2/2]
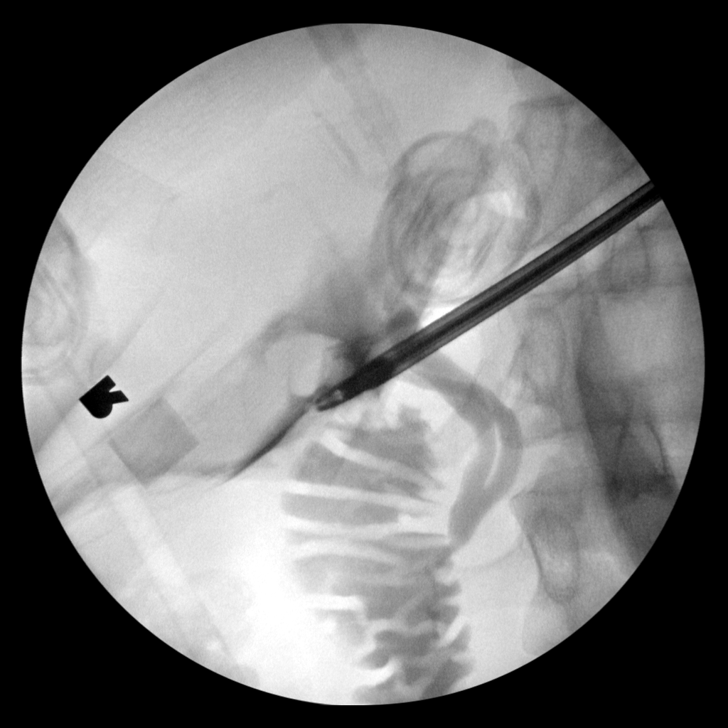

[8 of 8 positions shown; findings below may reference images not displayed]

FINDINGS: The cystic duct has been cannulated. There is limited filling of the
intrahepatic biliary ducts. There is a posterior sectoral duct which
inserts quite distally, into the distal common bile duct below the
cystic duct, nearly at the junction with the ampulla. . In the
visualized intrahepatic as well as in the visualize common hepatic
and common bile ducts, there is no mass or calculus. There is
apparent free flow of contrast via the common bile duct into the
duodenum.
IMPRESSION: No mass or calculus appreciated in the visualize biliary ductal
system. The anatomic variant with a posterior sectoral duct
inserting quite distally into the distal common bile duct near the
junction with the ampulla.

:
INTRAOPERATIVE CHOLANGIOGRAM

## 2016-12-16 ENCOUNTER — Ambulatory Visit (INDEPENDENT_AMBULATORY_CARE_PROVIDER_SITE_OTHER): Payer: Self-pay | Admitting: Physician Assistant

## 2016-12-16 ENCOUNTER — Encounter: Payer: Self-pay | Admitting: Physician Assistant

## 2016-12-16 VITALS — BP 113/73 | HR 86 | Temp 98.0°F | Ht 63.5 in | Wt 196.0 lb

## 2016-12-16 DIAGNOSIS — F322 Major depressive disorder, single episode, severe without psychotic features: Secondary | ICD-10-CM

## 2016-12-16 MED ORDER — BUPROPION HCL ER (XL) 150 MG PO TB24: 150.0000 mg | ORAL_TABLET | Freq: Every day | ORAL | 1 refills | Status: DC

## 2016-12-16 NOTE — Progress Notes (Signed)
BP 113/73   Pulse 86   Temp 98 F (36.7 C) (Oral)   Ht 5' 3.5" (1.613 m)   Wt 196 lb (88.9 kg)   LMP 07/25/2012   BMI 34.18 kg/m    Subjective:    Patient ID: Karen Gillespie, female    DOB: 1975/07/19, 41 y.o.   MRN: [id number]  HPI: Karen Gillespie is a 41 y.o. female presenting on 12/16/2016 for Depression and Weight loss consult  I seen this patient over the years. She is having a severe amount of depression currently. She has a son with autism and special needs. He did a very severe summer where he was doing a lot of self injury. They found that it was related to the medications he was placed on by his developmental neurologist. Things have calmed down a lot at this point. But the patient and she went through a severe depression during this time. Her PHQ scores are as follows  Depression screen PHQ 2/9 12/16/2016  Decreased Interest 3  Down, Depressed, Hopeless 2  PHQ - 2 Score 5  Altered sleeping 3  Tired, decreased energy 2  Change in appetite 3  Feeling bad or failure about yourself  3  Trouble concentrating 2  Moving slowly or fidgety/restless 1  Suicidal thoughts 1  PHQ-9 Score 20  Difficult doing work/chores Very difficult     Relevant past medical, surgical, family and social history reviewed and updated as indicated. Allergies and medications reviewed and updated.  Past Medical History:  Diagnosis Date  . Nail fungus    shingles    Past Surgical History:  Procedure Laterality Date  . CESAREAN SECTION     x 4  . CHOLECYSTECTOMY N/A 12/21/2012   Procedure: LAPAROSCOPIC CHOLECYSTECTOMY WITH INTRAOPERATIVE CHOLANGIOGRAM;  Surgeon: Dalia Heading, MD;  Location: [facility];  Service: General;  Laterality: N/A;  . Dilation and cutterage    . LEAP     . TUBAL LIGATION      Review of Systems  Constitutional: Positive for activity change, appetite change, fatigue and unexpected weight change.  HENT: Negative.   Respiratory: Negative.   Cardiovascular:  Negative.   Gastrointestinal: Negative.   Musculoskeletal: Negative.   Skin: Negative.   Psychiatric/Behavioral: Positive for decreased concentration, dysphoric mood and sleep disturbance. Negative for self-injury and suicidal ideas. The patient is nervous/anxious.     Allergies as of 12/16/2016      Reactions   Penicillins Shortness Of Breath, Swelling, Rash   Lamisil [terbinafine Hcl] Nausea Only   Codeine Swelling, Rash   Latex Swelling, Rash      Medication List       Accurate as of 12/16/16  2:38 PM. Always use your most recent med list.          buPROPion 150 MG 24 hr tablet Commonly known as:  WELLBUTRIN XL Take 1 tablet (150 mg total) by mouth daily.          Objective:    BP 113/73   Pulse 86   Temp 98 F (36.7 C) (Oral)   Ht 5' 3.5" (1.613 m)   Wt 196 lb (88.9 kg)   LMP 07/25/2012   BMI 34.18 kg/m   Allergies  Allergen Reactions  . Penicillins Shortness Of Breath, Swelling and Rash  . Lamisil [Terbinafine Hcl] Nausea Only  . Codeine Swelling and Rash  . Latex Swelling and Rash    Physical Exam  Constitutional: She is oriented to person, place, and  time. She appears well-developed and well-nourished.  HENT:  Head: Normocephalic and atraumatic.  Right Ear: Tympanic membrane, external ear and ear canal normal.  Left Ear: Tympanic membrane, external ear and ear canal normal.  Nose: Nose normal. No rhinorrhea.  Mouth/Throat: Oropharynx is clear and moist and mucous membranes are normal. No oropharyngeal exudate or posterior oropharyngeal erythema.  Eyes: Pupils are equal, round, and reactive to light. Conjunctivae and EOM are normal.  Neck: Normal range of motion. Neck supple.  Cardiovascular: Normal rate, regular rhythm, normal heart sounds and intact distal pulses.   Pulmonary/Chest: Effort normal and breath sounds normal.  Abdominal: Soft. Bowel sounds are normal.  Neurological: She is alert and oriented to person, place, and time. She has normal  reflexes.  Skin: Skin is warm and dry. No rash noted.  Psychiatric: She has a normal mood and affect. Her behavior is normal. Judgment and thought content normal.        Assessment & Plan:   1. Depression, major, single episode, severe (HCC) - buPROPion (WELLBUTRIN XL) 150 MG 24 hr tablet; Take 1 tablet (150 mg total) by mouth daily.  Dispense: 30 tablet; Refill: 1    Current Outpatient Prescriptions:  .  buPROPion (WELLBUTRIN XL) 150 MG 24 hr tablet, Take 1 tablet (150 mg total) by mouth daily., Disp: 30 tablet, Rfl: 1 Continue all other maintenance medications as listed above.  Follow up plan: Return in about 4 weeks (around 01/13/2017) for recheck.  Educational handout given for survey  Remus Loffler PA-C [facility] [address]  [place], [place] [address] [phone]   12/16/2016, 2:38 PM

## 2016-12-16 NOTE — Patient Instructions (Signed)
In a few days you may receive a survey in the mail or online from [employer] regarding your visit with us today. Please take a moment to fill this out. Your feedback is very important to our whole office. It can help us better understand your needs as well as improve your experience and satisfaction. Thank you for taking your time to complete it. We care about you.  Tavien Chestnut, PA-C  

## 2017-01-13 ENCOUNTER — Encounter: Payer: Self-pay | Admitting: Physician Assistant

## 2017-01-13 ENCOUNTER — Ambulatory Visit (INDEPENDENT_AMBULATORY_CARE_PROVIDER_SITE_OTHER): Payer: Self-pay | Admitting: Physician Assistant

## 2017-01-13 VITALS — BP 126/73 | HR 88 | Temp 98.1°F | Ht 63.0 in | Wt 199.4 lb

## 2017-01-13 DIAGNOSIS — F322 Major depressive disorder, single episode, severe without psychotic features: Secondary | ICD-10-CM

## 2017-01-13 DIAGNOSIS — J189 Pneumonia, unspecified organism: Secondary | ICD-10-CM

## 2017-01-13 MED ORDER — BUPROPION HCL ER (XL) 300 MG PO TB24: 300.0000 mg | ORAL_TABLET | Freq: Every day | ORAL | 5 refills | Status: DC

## 2017-01-13 MED ORDER — PHENTERMINE HCL 37.5 MG PO CAPS: 37.5000 mg | ORAL_CAPSULE | ORAL | 2 refills | Status: DC

## 2017-01-13 MED ORDER — AZITHROMYCIN 250 MG PO TABS: ORAL_TABLET | ORAL | 0 refills | Status: DC

## 2017-01-13 NOTE — Progress Notes (Signed)
BP 126/73   Pulse 88   Temp 98.1 F (36.7 C) (Oral)   Ht 5\' 3"  (1.6 m)   Wt 199 lb 6.4 oz (90.4 kg)   LMP 07/25/2012   BMI 35.32 kg/m    Subjective:    Patient ID: Karen Gillespie, female    DOB: 08-Nov-1975, 41 y.o.   MRN: [id number]  HPI: Karen GoonJacqueline N Weaver is a 41 y.o. female presenting on 01/13/2017 for Follow-up (depression) and Nasal Congestion  This patient comes in for periodic recheck on medications and conditions including depression and weight gain.  She has done very well with her Wellbutrin this past month.  She is tolerating it well.  Think we can increase the dose to 300 mg now.  This patient has had many days of sore throat and postnasal drainage, headache at times and sinus pressure. There is copious drainage at times. Denies any fever at this time. There has been a history of sinus infections in the past.  There is cough at night. It has become more prevalent in recent days.  All medications are reviewed today. There are no reports of any problems with the medications. All of the medical conditions are reviewed and updated.  Lab work is reviewed and will be ordered as medically necessary. There are no new problems reported with today's visit.  [facility] 01/13/2017 12/16/2016  Decreased Interest 1 3  Down, Depressed, Hopeless 1 2  PHQ - 2 Score 2 5  Altered sleeping 2 3  Tired, decreased energy 1 2  Change in appetite 2 3  Feeling bad or failure about yourself  0 3  Trouble concentrating 0 2  Moving slowly or fidgety/restless 0 1  Suicidal thoughts 0 1  PHQ-9 Score 7 20  Difficult doing work/chores - Very difficult     Relevant past medical, surgical, family and social history reviewed and updated as indicated. Allergies and medications reviewed and updated.  Past Medical History:  Diagnosis Date  . Nail fungus    shingles    Past Surgical History:  Procedure Laterality Date  . CESAREAN SECTION     x 4  . Dilation and cutterage      . LEAP     . TUBAL LIGATION      Review of Systems  Constitutional: Positive for chills and fatigue. Negative for activity change and appetite change.  HENT: Positive for congestion, postnasal drip and sore throat.   Eyes: Negative.   Respiratory: Positive for cough and wheezing.   Cardiovascular: Negative.  Negative for chest pain, palpitations and leg swelling.  Gastrointestinal: Negative.   Genitourinary: Negative.   Musculoskeletal: Negative.   Skin: Negative.   Neurological: Positive for headaches.    Allergies as of 01/13/2017      Reactions   Penicillins Shortness Of Breath, Swelling, Rash   Lamisil [terbinafine Hcl] Nausea Only   Codeine Swelling, Rash   Latex Swelling, Rash      Medication List        Accurate as of 01/13/17  4:07 PM. Always use your most recent med list.          azithromycin 250 MG tablet Commonly known as:  ZITHROMAX Z-PAK Take as directed   buPROPion 300 MG 24 hr tablet Commonly known as:  WELLBUTRIN XL Take 1 tablet (300 mg total) daily by mouth.   phentermine 37.5 MG capsule Take 1 capsule (37.5 mg total) every morning by mouth.  Objective:    BP 126/73   Pulse 88   Temp 98.1 F (36.7 C) (Oral)   Ht 5\' 3"  (1.6 m)   Wt 199 lb 6.4 oz (90.4 kg)   LMP 07/25/2012   BMI 35.32 kg/m   Allergies  Allergen Reactions  . Penicillins Shortness Of Breath, Swelling and Rash  . Lamisil [Terbinafine Hcl] Nausea Only  . Codeine Swelling and Rash  . Latex Swelling and Rash    Physical Exam  Constitutional: She is oriented to person, place, and time. She appears well-developed and well-nourished.  HENT:  Head: Normocephalic and atraumatic.  Right Ear: A middle ear effusion is present.  Left Ear: A middle ear effusion is present.  Nose: Mucosal edema present. Right sinus exhibits no frontal sinus tenderness. Left sinus exhibits no frontal sinus tenderness.  Mouth/Throat: Posterior oropharyngeal erythema present. No  oropharyngeal exudate or tonsillar abscesses.  Eyes: Conjunctivae and EOM are normal. Pupils are equal, round, and reactive to light.  Neck: Normal range of motion.  Cardiovascular: Normal rate, regular rhythm, normal heart sounds and intact distal pulses.  Pulmonary/Chest: Effort normal and breath sounds normal.  Abdominal: Soft. Bowel sounds are normal.  Neurological: She is alert and oriented to person, place, and time. She has normal reflexes.  Skin: Skin is warm and dry. No rash noted.  Psychiatric: She has a normal mood and affect. Her behavior is normal. Judgment and thought content normal.  Nursing note and vitals reviewed.       Assessment & Plan:   1. Depression, major, single episode, severe (HCC) - buPROPion (WELLBUTRIN XL) 300 MG 24 hr tablet; Take 1 tablet (300 mg total) daily by mouth.  Dispense: 30 tablet; Refill: 5  2. Atypical pneumonia Zpak take as directed  3. Weight gain Phentermine 1/2-1 tab prn appetite  Current Outpatient Medications:  .  azithromycin (ZITHROMAX Z-PAK) 250 MG tablet, Take as directed, Disp: 6 each, Rfl: 0 .  buPROPion (WELLBUTRIN XL) 300 MG 24 hr tablet, Take 1 tablet (300 mg total) daily by mouth., Disp: 30 tablet, Rfl: 5 .  phentermine 37.5 MG capsule, Take 1 capsule (37.5 mg total) every morning by mouth., Disp: 30 capsule, Rfl: 2 Continue all other maintenance medications as listed above.  Follow up plan: Return in about 3 months (around 04/15/2017) for recheck.  Educational handout given for survey  Remus LofflerAngel S. Caryn Gienger PA-C [facility] [address]  [place], [place] [id number] [phone]   01/13/2017, 4:07 PM

## 2017-01-13 NOTE — Patient Instructions (Signed)
In a few days you may receive a survey in the mail or online from [employer] regarding your visit with us today. Please take a moment to fill this out. Your feedback is very important to our whole office. It can help us better understand your needs as well as improve your experience and satisfaction. Thank you for taking your time to complete it. We care about you.  Annalissa Murphey, PA-C  

## 2017-04-17 ENCOUNTER — Ambulatory Visit: Payer: Self-pay | Admitting: Physician Assistant

## 2017-04-22 ENCOUNTER — Encounter: Payer: Self-pay | Admitting: Physician Assistant

## 2017-04-22 ENCOUNTER — Ambulatory Visit (INDEPENDENT_AMBULATORY_CARE_PROVIDER_SITE_OTHER): Payer: Self-pay | Admitting: Physician Assistant

## 2017-04-22 DIAGNOSIS — F322 Major depressive disorder, single episode, severe without psychotic features: Secondary | ICD-10-CM

## 2017-04-22 MED ORDER — PHENTERMINE HCL 37.5 MG PO CAPS: 37.5000 mg | ORAL_CAPSULE | ORAL | 2 refills | Status: DC

## 2017-04-22 MED ORDER — BUPROPION HCL ER (XL) 300 MG PO TB24: 300.0000 mg | ORAL_TABLET | Freq: Every day | ORAL | 5 refills | Status: DC

## 2017-04-26 NOTE — Progress Notes (Signed)
BP 110/69   Pulse (!) 123   Temp 98.1 F (36.7 C) (Oral)   Ht 5\' 3"  (1.6 m)   Wt 178 lb 9.6 oz (81 kg)   LMP 07/25/2012   BMI 31.64 kg/m    Subjective:    Patient ID: Karen Gillespie, female    DOB: 05-21-1975, 42 y.o.   MRN: [id number]  HPI: Karen Gillespie is a 42 y.o. female presenting on 04/22/2017 for Depression and Weight Loss  Patient comes in for [address]-month recheck on her medication.  She has lost weight well.  She did stop the Wellbutrin because she thought she did not need it.  However after stopping it she can see that there was more emotional eating.  She is going to restart the medication.  We will plan to recheck her in 3 months.  Relevant past medical, surgical, family and social history reviewed and updated as indicated. Allergies and medications reviewed and updated.  Past Medical History:  Diagnosis Date  . Nail fungus    shingles    Past Surgical History:  Procedure Laterality Date  . CESAREAN SECTION     x 4  . CHOLECYSTECTOMY N/A 12/21/2012   Procedure: LAPAROSCOPIC CHOLECYSTECTOMY WITH INTRAOPERATIVE CHOLANGIOGRAM;  Surgeon: Dalia HeadingMark A Jenkins, MD;  Location: [facility];  Service: General;  Laterality: N/A;  . Dilation and cutterage    . LEAP     . TUBAL LIGATION      Review of Systems  Constitutional: Negative.   HENT: Negative.   Eyes: Negative.   Respiratory: Negative.   Gastrointestinal: Negative.   Genitourinary: Negative.     Allergies as of 04/22/2017      Reactions   Penicillins Shortness Of Breath, Swelling, Rash   Lamisil [terbinafine] Nausea Only   Codeine Swelling, Rash   Latex Swelling, Rash      Medication List        Accurate as of 04/22/17 11:59 PM. Always use your most recent med list.          buPROPion 300 MG 24 hr tablet Commonly known as:  WELLBUTRIN XL Take 1 tablet (300 mg total) by mouth daily.   phentermine 37.5 MG capsule Take 1 capsule (37.5 mg total) by mouth every morning.          Objective:      BP 110/69   Pulse (!) 123   Temp 98.1 F (36.7 C) (Oral)   Ht 5\' 3"  (1.6 m)   Wt 178 lb 9.6 oz (81 kg)   LMP 07/25/2012   BMI 31.64 kg/m   Allergies  Allergen Reactions  . Penicillins Shortness Of Breath, Swelling and Rash  . Lamisil [Terbinafine] Nausea Only  . Codeine Swelling and Rash  . Latex Swelling and Rash    Physical Exam  Constitutional: She is oriented to person, place, and time. She appears well-developed and well-nourished.  HENT:  Head: Normocephalic and atraumatic.  Eyes: Conjunctivae and EOM are normal. Pupils are equal, round, and reactive to light.  Cardiovascular: Normal rate, regular rhythm, normal heart sounds and intact distal pulses.  Pulmonary/Chest: Effort normal and breath sounds normal.  Abdominal: Soft. Bowel sounds are normal.  Neurological: She is alert and oriented to person, place, and time. She has normal reflexes.  Skin: Skin is warm and dry. No rash noted.  Psychiatric: She has a normal mood and affect. Her behavior is normal. Judgment and thought content normal.    Results for orders placed or performed during  the hospital encounter of 12/19/12  Surgical pcr screen  Result Value Ref Range   MRSA, PCR NEGATIVE NEGATIVE   Staphylococcus aureus NEGATIVE NEGATIVE  CBC with Differential  Result Value Ref Range   WBC 8.0 4.0 - 10.5 K/uL   RBC 4.32 3.87 - 5.11 MIL/uL   Hemoglobin 11.9 (L) 12.0 - 15.0 g/dL   HCT 16.1 (L) 09.6 - 04.5 %   MCV 83.1 78.0 - 100.0 fL   MCH 27.5 26.0 - 34.0 pg   MCHC 33.1 30.0 - 36.0 g/dL   RDW 40.9 81.1 - 91.4 %   Platelets 228 150 - 400 K/uL   Neutrophils Relative % 73 43 - 77 %   Neutro Abs 5.8 1.7 - 7.7 K/uL   Lymphocytes Relative 18 12 - 46 %   Lymphs Abs 1.5 0.7 - 4.0 K/uL   Monocytes Relative 7 3 - 12 %   Monocytes Absolute 0.6 0.1 - 1.0 K/uL   Eosinophils Relative 2 0 - 5 %   Eosinophils Absolute 0.1 0.0 - 0.7 K/uL   Basophils Relative 0 0 - 1 %   Basophils Absolute 0.0 0.0 - 0.1 K/uL   Comprehensive metabolic panel  Result Value Ref Range   Sodium 135 135 - 145 mEq/L   Potassium 3.6 3.5 - 5.1 mEq/L   Chloride 100 96 - 112 mEq/L   CO2 26 19 - 32 mEq/L   Glucose, Bld 115 (H) 70 - 99 mg/dL   BUN 8 6 - 23 mg/dL   Creatinine, Ser 7.82 0.50 - 1.10 mg/dL   Calcium 8.8 8.4 - 95.6 mg/dL   Total Protein 6.5 6.0 - 8.3 g/dL   Albumin 4.0 3.5 - 5.2 g/dL   AST 213 (H) 0 - 37 U/L   ALT 189 (H) 0 - 35 U/L   Alkaline Phosphatase 123 (H) 39 - 117 U/L   Total Bilirubin 4.1 (H) 0.3 - 1.2 mg/dL   GFR calc non Af Amer >90 >90 mL/min   GFR calc Af Amer >90 >90 mL/min  Urinalysis, Routine w reflex microscopic  Result Value Ref Range   Color, Urine YELLOW YELLOW   APPearance CLEAR CLEAR   Specific Gravity, Urine 1.015 1.005 - 1.030   pH 7.0 5.0 - 8.0   Glucose, UA NEGATIVE NEGATIVE mg/dL   Hgb urine dipstick TRACE (A) NEGATIVE   Bilirubin Urine SMALL (A) NEGATIVE   Ketones, ur NEGATIVE NEGATIVE mg/dL   Protein, ur NEGATIVE NEGATIVE mg/dL   Urobilinogen, UA 0.2 0.0 - 1.0 mg/dL   Nitrite NEGATIVE NEGATIVE   Leukocytes, UA NEGATIVE NEGATIVE  Urine microscopic-add on  Result Value Ref Range   Squamous Epithelial / LPF RARE RARE   WBC, UA 0-2 <3 WBC/hpf   RBC / HPF 3-6 <3 RBC/hpf   Bacteria, UA RARE RARE  Bilirubin, direct  Result Value Ref Range   Bilirubin, Direct 1.9 (H) 0.0 - 0.3 mg/dL  Basic metabolic panel  Result Value Ref Range   Sodium 139 135 - 145 mEq/L   Potassium 4.2 3.5 - 5.1 mEq/L   Chloride 105 96 - 112 mEq/L   CO2 26 19 - 32 mEq/L   Glucose, Bld 110 (H) 70 - 99 mg/dL   BUN 5 (L) 6 - 23 mg/dL   Creatinine, Ser 0.86 0.50 - 1.10 mg/dL   Calcium 9.1 8.4 - 57.8 mg/dL   GFR calc non Af Amer >90 >90 mL/min   GFR calc Af Amer >90 >90 mL/min  Magnesium  Result Value Ref Range   Magnesium 2.1 1.5 - 2.5 mg/dL  Phosphorus  Result Value Ref Range   Phosphorus 3.0 2.3 - 4.6 mg/dL  CBC  Result Value Ref Range   WBC 5.5 4.0 - 10.5 K/uL   RBC 4.05 3.87 - 5.11  MIL/uL   Hemoglobin 11.2 (L) 12.0 - 15.0 g/dL   HCT 16.1 (L) 09.6 - 04.5 %   MCV 84.2 78.0 - 100.0 fL   MCH 27.7 26.0 - 34.0 pg   MCHC 32.8 30.0 - 36.0 g/dL   RDW 40.9 81.1 - 91.4 %   Platelets 223 150 - 400 K/uL  Hepatic function panel  Result Value Ref Range   Total Protein 6.1 6.0 - 8.3 g/dL   Albumin 3.5 3.5 - 5.2 g/dL   AST 70 (H) 0 - 37 U/L   ALT 129 (H) 0 - 35 U/L   Alkaline Phosphatase 118 (H) 39 - 117 U/L   Total Bilirubin 4.0 (H) 0.3 - 1.2 mg/dL   Bilirubin, Direct 2.3 (H) 0.0 - 0.3 mg/dL   Indirect Bilirubin 1.7 (H) 0.3 - 0.9 mg/dL  Hepatic function panel  Result Value Ref Range   Total Protein 6.7 6.0 - 8.3 g/dL   Albumin 3.6 3.5 - 5.2 g/dL   AST 43 (H) 0 - 37 U/L   ALT 101 (H) 0 - 35 U/L   Alkaline Phosphatase 132 (H) 39 - 117 U/L   Total Bilirubin 2.9 (H) 0.3 - 1.2 mg/dL   Bilirubin, Direct 1.4 (H) 0.0 - 0.3 mg/dL   Indirect Bilirubin 1.5 (H) 0.3 - 0.9 mg/dL      Assessment & Plan:   1. Depression, major, single episode, severe (HCC) - buPROPion (WELLBUTRIN XL) 300 MG 24 hr tablet; Take 1 tablet (300 mg total) by mouth daily.  Dispense: 30 tablet; Refill: 5    Current Outpatient Medications:  .  buPROPion (WELLBUTRIN XL) 300 MG 24 hr tablet, Take 1 tablet (300 mg total) by mouth daily., Disp: 30 tablet, Rfl: 5 .  phentermine 37.5 MG capsule, Take 1 capsule (37.5 mg total) by mouth every morning., Disp: 30 capsule, Rfl: 2 Continue all other maintenance medications as listed above.  Follow up plan: Return in about 3 months (around 07/20/2017) for recheck.  Educational handout given for survey   Remus Loffler PA-C [facility] [address]  [place], [place] [address] [phone]   04/26/2017, 4:07 PM

## 2017-07-21 ENCOUNTER — Ambulatory Visit: Payer: Self-pay | Admitting: Physician Assistant

## 2018-08-03 ENCOUNTER — Other Ambulatory Visit: Payer: Self-pay

## 2018-08-03 ENCOUNTER — Encounter: Payer: Self-pay | Admitting: Physician Assistant

## 2018-08-03 ENCOUNTER — Ambulatory Visit (INDEPENDENT_AMBULATORY_CARE_PROVIDER_SITE_OTHER): Payer: Self-pay | Admitting: Physician Assistant

## 2018-08-03 DIAGNOSIS — R635 Abnormal weight gain: Secondary | ICD-10-CM

## 2018-08-03 MED ORDER — PHENTERMINE HCL 37.5 MG PO CAPS: 37.5000 mg | ORAL_CAPSULE | ORAL | 2 refills | Status: DC

## 2018-08-03 NOTE — Progress Notes (Signed)
    Telephone visit  Subjective: CC: Weight gain PCP: Remus Loffler, PA-C [id number] Karen Gillespie is a 43 y.o. female calls for telephone consult today. Patient provides verbal consent for consult held via phone.  Patient is identified with 2 separate identifiers.  At this time the entire area is on COVID-19 social distancing and stay home orders are in place.  Patient is of higher risk and therefore we are performing this by a virtual method.  Location of patient: Home Location of provider: HOME Others present for call: no  The patient wants to restart her phentermine.  She had taken it over a year ago.  She states she is back up to 200 pounds and is really trying to work on her diet and exercise.  She had good success with the phentermine in the past and would like to restart it.  We will have her come back in in a few months for recheck and weight check   ROS: Per HPI  Allergies  Allergen Reactions  . Penicillins Shortness Of Breath, Swelling and Rash  . Lamisil [Terbinafine] Nausea Only  . Codeine Swelling and Rash  . Latex Swelling and Rash   Past Medical History:  Diagnosis Date  . Nail fungus    shingles    Current Outpatient Medications:  .  phentermine 37.5 MG capsule, Take 1 capsule (37.5 mg total) by mouth every morning., Disp: 30 capsule, Rfl: 2  Assessment/ Plan: 43 y.o. female   1. Weight gain - phentermine 37.5 MG capsule; Take 1 capsule (37.5 mg total) by mouth every morning.  Dispense: 30 capsule; Refill: 2   Start time: 2:00 PM End time: 2:06 PM  Meds ordered this encounter  Medications  . phentermine 37.5 MG capsule    Sig: Take 1 capsule (37.5 mg total) by mouth every morning.    Dispense:  30 capsule    Refill:  2    Order Specific Question:   Supervising Provider    Answer:   Raliegh Ip [[id number]]    Prudy Feeler PA-C [facility] [phone]

## 2018-08-09 ENCOUNTER — Encounter: Payer: Self-pay | Admitting: Family Medicine

## 2018-08-09 ENCOUNTER — Other Ambulatory Visit: Payer: Self-pay

## 2018-08-09 ENCOUNTER — Ambulatory Visit (INDEPENDENT_AMBULATORY_CARE_PROVIDER_SITE_OTHER): Payer: Self-pay | Admitting: Family Medicine

## 2018-08-09 DIAGNOSIS — J020 Streptococcal pharyngitis: Secondary | ICD-10-CM

## 2018-08-09 MED ORDER — AZITHROMYCIN 250 MG PO TABS
ORAL_TABLET | ORAL | 0 refills | Status: DC
Start: 2018-08-09 — End: 2019-02-16

## 2018-08-09 NOTE — Progress Notes (Addendum)
No chief complaint on file.   HPI  Patient presents today for Patient presents with upper respiratory congestion. No rhinorrhea. There is moderate sore throat. Patient reports no cough. There is no fever, but she reports chills . The patient denies being short of breath. Onset was yesterday. Gradually worsening. Tried OTCs without improvement. Son dx with strep earlier today.   PMH: Smoking status noted ROS: Per HPI  Objective: Exam deferred. Pt. Harboring due to COVID 19. Phone visit performed.  Assessment and plan:  1. Strep pharyngitis     Meds ordered this encounter  Medications  . azithromycin (ZITHROMAX Z-PAK) 250 MG tablet    Sig: Take two right away Then one a day for the next 4 days.    Dispense:  6 each    Refill:  0    Virtual Visit via telephone Note  I discussed the limitations, risks, security and privacy concerns of performing an evaluation and management service by telephone and the availability of in person appointments. I also discussed with the patient that there may be a patient responsible charge related to this service. The patient expressed understanding and agreed to proceed. Pt. Is at home. Dr. Darlyn Read is in his office.  Follow Up Instructions:   I discussed the assessment and treatment plan with the patient. The patient was provided an opportunity to ask questions and all were answered. The patient agreed with the plan and demonstrated an understanding of the instructions.   The patient was advised to call back or seek an in-person evaluation if the symptoms worsen or if the condition fails to improve as anticipated.   Total minutes including chart review and phone contact time: 12   Follow up as needed.  Mechele Claude, MD

## 2018-11-03 ENCOUNTER — Ambulatory Visit (INDEPENDENT_AMBULATORY_CARE_PROVIDER_SITE_OTHER): Payer: Medicaid Other | Admitting: Physician Assistant

## 2018-11-03 ENCOUNTER — Encounter: Payer: Self-pay | Admitting: Physician Assistant

## 2018-11-03 DIAGNOSIS — R635 Abnormal weight gain: Secondary | ICD-10-CM

## 2018-11-03 MED ORDER — PHENTERMINE HCL 37.5 MG PO CAPS: 37.5000 mg | ORAL_CAPSULE | ORAL | 2 refills | Status: DC

## 2018-11-03 NOTE — Progress Notes (Signed)
    Telephone visit  Subjective: Karen Gillespie check PCP: Karen Sleeper, PA-C [id number] [name] is a 43 y.o. female calls for telephone consult today. Patient provides verbal consent for consult held via phone.  Patient is identified with 2 separate identifiers.  At this time the entire area is on COVID-19 social distancing and stay home orders are in place.  Patient is of higher risk and therefore we are performing this by a virtual method.  Location of patient: home Location of provider: HOME Others present for call: no  This is a 42-month recheck for the patient's dietary efforts.  She has been using phentermine 1 daily.  Over the past few months she has lost 8 pounds.  When she had a visit the last time she went in weight on her mother scale after the visit and she was at 190 pounds then.  Today she is at 183 pounds.  She has not had opportunity to have her blood pressure checked.  She states that overall she is feeling good not have any difficulties with her medication.  We will plan to continue the phentermine for the next 3 months and plan to recheck her in 3 months.  She can call us if there are any difficulties.   ROS: Per HPI  Allergies  Allergen Reactions  . Penicillins Shortness Of Breath, Swelling and Rash  . Lamisil [Terbinafine] Nausea Only  . Codeine Swelling and Rash  . Latex Swelling and Rash   Past Medical History:  Diagnosis Date  . Nail fungus    shingles    Current Outpatient Medications:  .  azithromycin (ZITHROMAX Z-PAK) 250 MG tablet, Take two right away Then one a day for the next 4 days., Disp: 6 each, Rfl: 0 .  phentermine 37.5 MG capsule, Take 1 capsule (37.5 mg total) by mouth every morning., Disp: 30 capsule, Rfl: 2  Assessment/ Plan: 43 y.o. female   1. Weight gain - phentermine 37.5 MG capsule; Take 1 capsule (37.5 mg total) by mouth every morning.  Dispense: 30 capsule; Refill: 2   No follow-ups on file.  Continue all other maintenance  medications as listed above.  Start time: 10:51 AM End time: 10:57 AM  Meds ordered this encounter  Medications  . phentermine 37.5 MG capsule    Sig: Take 1 capsule (37.5 mg total) by mouth every morning.    Dispense:  30 capsule    Refill:  2    Order Specific Question:   Supervising Provider    Answer:   Janora Norlander [[id number]]    Particia Nearing PA-C Home [phone]

## 2019-02-16 ENCOUNTER — Encounter: Payer: Self-pay | Admitting: Physician Assistant

## 2019-02-16 ENCOUNTER — Ambulatory Visit (INDEPENDENT_AMBULATORY_CARE_PROVIDER_SITE_OTHER): Payer: Medicaid Other | Admitting: Physician Assistant

## 2019-02-16 DIAGNOSIS — R635 Abnormal weight gain: Secondary | ICD-10-CM

## 2019-02-16 MED ORDER — PHENTERMINE HCL 37.5 MG PO CAPS: 37.5000 mg | ORAL_CAPSULE | ORAL | 2 refills | Status: DC

## 2019-02-16 NOTE — Progress Notes (Signed)
    Telephone visit  Subjective: CC:3 month recheck PCP: Terald Sleeper, PA-C [id number] Karen Gillespie is a 43 y.o. female calls for telephone consult today. Patient provides verbal consent for consult held via phone.  Patient is identified with 2 separate identifiers.  At this time the entire area is on COVID-19 social distancing and stay home orders are in place.  Patient is of higher risk and therefore we are performing this by a virtual method.  Location of patient: home Location of provider: HOME Others present for call: no    Weight is 163  Patient is having a 72-month recheck on her weight loss efforts.  She is continue to reduce calories and try to walk and be active as much as she can.  She does have special needs teenage son who is having to be home all the time right now because school is out.  So sometimes her time alone is very limited.  Some days he does feel up for going for walks and she tries to do that but sometimes he does not.  She reports that she is still having good reduction in appetite and working on the calorie restriction.  She has lost about 40 pounds over the past 2 years.  I have commended her on her efforts and hope that she will continue working hard. ROS: Per HPI  Allergies  Allergen Reactions  . Penicillins Shortness Of Breath, Swelling and Rash  . Lamisil [Terbinafine] Nausea Only  . Codeine Swelling and Rash  . Latex Swelling and Rash   Past Medical History:  Diagnosis Date  . Nail fungus    shingles    Current Outpatient Medications:  .  azithromycin (ZITHROMAX Z-PAK) 250 MG tablet, Take two right away Then one a day for the next 4 days., Disp: 6 each, Rfl: 0 .  phentermine 37.5 MG capsule, Take 1 capsule (37.5 mg total) by mouth every morning., Disp: 30 capsule, Rfl: 2  Assessment/ Plan: 43 y.o. female   1. Weight gain - phentermine 37.5 MG capsule; Take 1 capsule (37.5 mg total) by mouth every morning.  Dispense: 30 capsule; Refill: 2   No follow-ups on file.  Continue all other maintenance medications as listed above.  Start time: 8:23 AA End time: 8:30 AM  No orders of the defined types were placed in this encounter.   Particia Nearing PA-C [place] [phone]

## 2020-02-06 ENCOUNTER — Ambulatory Visit: Payer: Medicaid Other | Admitting: Family Medicine

## 2020-02-09 ENCOUNTER — Ambulatory Visit: Payer: Medicaid Other | Admitting: Family Medicine

## 2020-02-15 ENCOUNTER — Encounter: Payer: Self-pay | Admitting: Family Medicine

## 2020-02-15 ENCOUNTER — Other Ambulatory Visit: Payer: Self-pay

## 2020-02-15 ENCOUNTER — Ambulatory Visit (INDEPENDENT_AMBULATORY_CARE_PROVIDER_SITE_OTHER): Payer: Self-pay | Admitting: Family Medicine

## 2020-02-15 VITALS — BP 117/68 | HR 80 | Temp 97.7°F | Ht 63.5 in | Wt 177.0 lb

## 2020-02-15 DIAGNOSIS — R635 Abnormal weight gain: Secondary | ICD-10-CM

## 2020-02-15 DIAGNOSIS — G478 Other sleep disorders: Secondary | ICD-10-CM

## 2020-02-15 DIAGNOSIS — F419 Anxiety disorder, unspecified: Secondary | ICD-10-CM

## 2020-02-15 DIAGNOSIS — E669 Obesity, unspecified: Secondary | ICD-10-CM

## 2020-02-15 DIAGNOSIS — F339 Major depressive disorder, recurrent, unspecified: Secondary | ICD-10-CM

## 2020-02-15 MED ORDER — BUPROPION HCL ER (XL) 150 MG PO TB24: ORAL_TABLET | ORAL | 1 refills | Status: DC

## 2020-02-15 MED ORDER — PHENTERMINE HCL 37.5 MG PO CAPS: 37.5000 mg | ORAL_CAPSULE | ORAL | 2 refills | Status: DC

## 2020-02-15 NOTE — Patient Instructions (Signed)
Phentermine sustained-release capsules What is this medicine? PHENTERMINE (FEN ter meen) decreases your appetite. It is used with a reduced calorie diet and exercise to help you lose weight. This medicine may be used for other purposes; ask your health care provider or pharmacist if you have questions. COMMON BRAND NAME(S): Ionamin, Pro-Fast What should I tell my health care provider before I take this medicine? They need to know if you have any of these conditions:  agitation or nervousness  diabetes  glaucoma  heart disease  high blood pressure  history of drug abuse or addiction  history of stroke  kidney disease  lung disease called Primary Pulmonary Hypertension (PPH)  taken an MAOI like Carbex, Eldepryl, Marplan, Nardil, or Parnate in last 14 days  taking stimulant medicines for attention disorders, weight loss, or to stay awake  thyroid disease  an unusual or allergic reaction to phentermine, other medicines, foods, dyes, or preservatives  pregnant or trying to get pregnant  breast-feeding How should I use this medicine? Take this medicine by mouth with a glass of water. Follow the directions on the prescription label. Do not cut, crush or chew this medicine. Swallow the capsules whole. Take your medicine at regular intervals. Do not take it more often than directed. Do not stop taking except on your doctor's advice. Talk to your pediatrician regarding the use of this medicine in children. Special care may be needed. Overdosage: If you think you have taken too much of this medicine contact a poison control center or emergency room at once. NOTE: This medicine is only for you. Do not share this medicine with others. What if I miss a dose? If you miss a dose, skip it. Take your next dose at the normal time. Do not take extra or 2 doses at the same time to make up for the missed dose. What may interact with this medicine? Do not take this medicine with any of the  following medications:  MAOIs like Carbex, Eldepryl, Marplan, Nardil, and Parnate This medicine may also interact with the following medications:  alcohol  certain medicines for depression, anxiety, or psychotic disorders  certain medicines for high blood pressure  linezolid  medicines for colds or breathing difficulties like pseudoephedrine or phenylephrine  medicines for diabetes  sibutramine  stimulant medicines for attention disorders, weight loss, or to stay awake This list may not describe all possible interactions. Give your health care provider a list of all the medicines, herbs, non-prescription drugs, or dietary supplements you use. Also tell them if you smoke, drink alcohol, or use illegal drugs. Some items may interact with your medicine. What should I watch for while using this medicine? Visit your doctor or health care provider for regular checks on your progress. Do not stop taking except on your health care provider's advice. You may develop a severe reaction. Your health care provider will tell you how much medicine to take. Do not take this medicine close to bedtime. It may prevent you from sleeping. You may get drowsy or dizzy. Do not drive, use machinery, or do anything that needs mental alertness until you know how this medicine affects you. Do not stand or sit up quickly, especially if you are an older patient. This reduces the risk of dizzy or fainting spells. Alcohol may increase dizziness and drowsiness. Avoid alcoholic drinks. This medicine may affect blood sugar levels. Ask your healthcare provider if changes in diet or medicines are needed if you have diabetes. Women should inform their health  care provider if they wish to become pregnant or think they might be pregnant. Losing weight while pregnant is not advised and may cause harm to the unborn child. Talk to your health care provider for more information. What side effects may I notice from receiving this  medicine? Side effects that you should report to your doctor or health care professional as soon as possible:  allergic reactions like skin rash, itching or hives, swelling of the face, lips, or tongue  breathing problems  changes in emotions or moods  changes in vision  chest pain or chest tightness  fast, irregular heartbeat  feeling faint or lightheaded  increased blood pressure  irritable  restlessness  tremors  seizures  signs and symptoms of a stroke like changes in vision; confusion; trouble speaking or understanding; severe headaches; sudden numbness or weakness of the face, arm or leg; trouble walking; dizziness; loss of balance or coordination  unusually weak or tired Side effects that usually do not require medical attention (report to your doctor or health care professional if they continue or are bothersome):  changes in taste  constipation or diarrhea  dizziness  dry mouth  headache  trouble sleeping  upset stomach This list may not describe all possible side effects. Call your doctor for medical advice about side effects. You may report side effects to FDA at 1-800-FDA-1088. Where should I keep my medicine? Keep out of the reach of children. This medicine can be abused. Keep your medicine in a safe place to protect it from theft. Do not share this medicine with anyone. Selling or giving away this medicine is dangerous and against the law. This medicine may cause harm and death if it is taken by other adults, children, or pets. Return medicine that has not been used to an official disposal site. Contact the DEA at 878-361-3550 or your city/county government to find a site. If you cannot return the medicine, mix any unused medicine with a substance like cat litter or coffee grounds. Then throw the medicine away in a sealed container like a sealed bag or coffee can with a lid. Do not use the medicine after the expiration date. Store at room temperature  between 20 and 25 degrees C (68 and 77 degrees F). Keep container tightly closed. NOTE: This sheet is a summary. It may not cover all possible information. If you have questions about this medicine, talk to your doctor, pharmacist, or health care provider.  2020 Elsevier/Gold Standard (2018-12-31 12:48:37) Obesity, Adult Obesity is the condition of having too much total body fat. Being overweight or obese means that your weight is greater than what is considered healthy for your body size. Obesity is determined by a measurement called BMI. BMI is an estimate of body fat and is calculated from height and weight. For adults, a BMI of 30 or higher is considered obese. Obesity can lead to other health concerns and major illnesses, including:  Stroke.  Coronary artery disease (CAD).  Type 2 diabetes.  Some types of cancer, including cancers of the colon, breast, uterus, and gallbladder.  Osteoarthritis.  High blood pressure (hypertension).  High cholesterol.  Sleep apnea.  Gallbladder stones.  Infertility problems. What are the causes? Common causes of this condition include:  Eating daily meals that are high in calories, sugar, and fat.  Being born with genes that may make you more likely to become obese.  Having a medical condition that causes obesity, including: ? Hypothyroidism. ? Polycystic ovarian syndrome (PCOS). ?  Binge-eating disorder. ? Cushing syndrome.  Taking certain medicines, such as steroids, antidepressants, and seizure medicines.  Not being physically active (sedentary lifestyle).  Not getting enough sleep.  Drinking high amounts of sugar-sweetened beverages, such as soft drinks. What increases the risk? The following factors may make you more likely to develop this condition:  Having a family history of obesity.  Being a woman of African American descent.  Being a man of Hispanic descent.  Living in an area with limited access to: ? [place] [facility],  recreation centers, or sidewalks. ? Healthy food choices, such as grocery stores and farmers' markets. What are the signs or symptoms? The main sign of this condition is having too much body fat. How is this diagnosed? This condition is diagnosed based on:  Your BMI. If you are an adult with a BMI of 30 or higher, you are considered obese.  Your waist circumference. This measures the distance around your waistline.  Your skinfold thickness. Your health care provider may gently pinch a fold of your skin and measure it. You may have other tests to check for underlying conditions. How is this treated? Treatment for this condition often includes changing your lifestyle. Treatment may include some or all of the following:  Dietary changes. This may include developing a healthy meal plan.  Regular physical activity. This may include activity that causes your heart to beat faster (aerobic exercise) and strength training. Work with your health care provider to design an exercise program that works for you.  Medicine to help you lose weight if you are unable to lose 1 pound a week after 6 weeks of healthy eating and more physical activity.  Treating conditions that cause the obesity (underlying conditions).  Surgery. Surgical options may include gastric banding and gastric bypass. Surgery may be done if: ? Other treatments have not helped to improve your condition. ? You have a BMI of 40 or higher. ? You have life-threatening health problems related to obesity. Follow these instructions at home: Eating and drinking   Follow recommendations from your health care provider about what you eat and drink. Your health care provider may advise you to: ? Limit fast food, sweets, and processed snack foods. ? Choose low-fat options, such as low-fat milk instead of whole milk. ? Eat 5 or more servings of fruits or vegetables every day. ? Eat at home more often. This gives you more control over what  you eat. ? Choose healthy foods when you eat out. ? Learn to read food labels. This will help you understand how much food is considered 1 serving. ? Learn what a healthy serving size is. ? Keep low-fat snacks available. ? Limit sugary drinks, such as soda, fruit juice, sweetened iced tea, and flavored milk.  Drink enough water to keep your urine pale yellow.  Do not follow a fad diet. Fad diets can be unhealthy and even dangerous. Physical activity  Exercise regularly, as told by your health care provider. ? Most adults should get up to 150 minutes of moderate-intensity exercise every week. ? Ask your health care provider what types of exercise are safe for you and how often you should exercise.  Warm up and stretch before being active.  Cool down and stretch after being active.  Rest between periods of activity. Lifestyle  Work with your health care provider and a dietitian to set a weight-loss goal that is healthy and reasonable for you.  Limit your screen time.  Find ways to reward yourself  that do not involve food.  Do not drink alcohol if: ? Your health care provider tells you not to drink. ? You are pregnant, may be pregnant, or are planning to become pregnant.  If you drink alcohol: ? Limit how much you use to:  0-1 drink a day for women.  0-2 drinks a day for men. ? Be aware of how much alcohol is in your drink. In the [place] one drink equals one 12 oz bottle of beer (355 mL), one 5 oz glass of wine (148 mL), or one 1 oz glass of hard liquor (44 mL). General instructions  Keep a weight-loss journal to keep track of the food you eat and how much exercise you get.  Take over-the-counter and prescription medicines only as told by your health care provider.  Take vitamins and supplements only as told by your health care provider.  Consider joining a support group. Your health care provider may be able to recommend a support group.  Keep all follow-up visits as  told by your health care provider. This is important. Contact a health care provider if:  You are unable to meet your weight loss goal after 6 weeks of dietary and lifestyle changes. Get help right away if you are having:  Trouble breathing.  Suicidal thoughts or behaviors. Summary  Obesity is the condition of having too much total body fat.  Being overweight or obese means that your weight is greater than what is considered healthy for your body size.  Work with your health care provider and a dietitian to set a weight-loss goal that is healthy and reasonable for you.  Exercise regularly, as told by your health care provider. Ask your health care provider what types of exercise are safe for you and how often you should exercise. This information is not intended to replace advice given to you by your health care provider. Make sure you discuss any questions you have with your health care provider. Document Revised: 10/29/2017 Document Reviewed: 10/29/2017 Elsevier Patient Education  2020 [employer].

## 2020-02-15 NOTE — Progress Notes (Signed)
Subjective: CC: weight gain PCP: Gabriel Earing, FNP  HAL:[id number] BIJOU Karen Gillespie is a 44 y.o. female presenting to clinic today for:  1. Weight gain She reports about 15 lbs of weight gain over the last year. She has used phentermine in the past with good results. She lost 35-40 lbs with phentermine. She has not used phentermine for about a year. She did not have side effects with the phentermine other than a headache for the first few days. She does exercise because she is busy care for her child with medical issues. She reports that her diet is "bad". She often gets up during the night to eat when she is kind of asleep. She happens every night and happens up to 5x a night.   2. Depression She has tried Wellbutrin in the past with good results. She does not have a good relationship with her brother due to his drug use. He has recently moved next door with her parents and she has had problems with this. She has done talk therapy before with Creola Corn in [place] and is interested in doing this again. She is also interested in restarting Wellbutrin.   Depression screen [facility] 2/9 02/15/2020 04/22/2017 01/13/2017  Decreased Interest 3 1 1   Down, Depressed, Hopeless 2 1 1   PHQ - 2 Score 5 2 2   Altered sleeping 3 2 2   Tired, decreased energy 1 0 1  Change in appetite 3 2 2   Feeling bad or failure about yourself  0 0 0  Trouble concentrating 1 0 0  Moving slowly or fidgety/restless 3 0 0  Suicidal thoughts 0 0 0  PHQ-9 Score 16 6 7   Difficult doing work/chores Somewhat difficult - -                                                                                                                                                                                                                                     Relevant past medical, surgical, family, and social history reviewed and updated as indicated.  Allergies and medications reviewed and updated.  Allergies  Allergen Reactions  .  Penicillins Shortness Of Breath, Swelling and Rash  . Lamisil [Terbinafine] Nausea Only  . Codeine Swelling and Rash  . Latex Swelling and Rash   Past Medical History:  Diagnosis Date  . Nail fungus    shingles   No current outpatient medications on file.  Social History   Socioeconomic History  . Marital status: Married    Spouse name: Not on file  . Number of children: Not on file  . Years of education: Not on file  . Highest education level: Not on file  Occupational History  . Not on file  Tobacco Use  . Smoking status: Current Every Day Smoker    Packs/day: 0.50    Years: 15.00    Pack years: 7.50    Types: Cigarettes  . Smokeless tobacco: Never Used  Vaping Use  . Vaping Use: Never used  Substance and Sexual Activity  . Alcohol use: No  . Drug use: No  . Sexual activity: Yes    Birth control/protection: Surgical  Other Topics Concern  . Not on file  Social History Narrative  . Not on file   Social Determinants of Health   Financial Resource Strain:   . Difficulty of Paying Living Expenses: Not on file  Food Insecurity:   . Worried About Programme researcher, broadcasting/film/video in the Last Year: Not on file  . Ran Out of Food in the Last Year: Not on file  Transportation Needs:   . Lack of Transportation (Medical): Not on file  . Lack of Transportation (Non-Medical): Not on file  Physical Activity:   . Days of Exercise per Week: Not on file  . Minutes of Exercise per Session: Not on file  Stress:   . Feeling of Stress : Not on file  Social Connections:   . Frequency of Communication with Friends and Family: Not on file  . Frequency of Social Gatherings with Friends and Family: Not on file  . Attends Religious Services: Not on file  . Active Member of Clubs or Organizations: Not on file  . Attends Banker Meetings: Not on file  . Marital Status: Not on file  Intimate Partner Violence:   . Fear of Current or Ex-Partner: Not on file  . Emotionally Abused: Not  on file  . Physically Abused: Not on file  . Sexually Abused: Not on file   Family History  Problem Relation Age of Onset  . Diabetes Mother   . Hypertension Mother   . Heart disease Maternal Grandmother   . Heart failure Maternal Grandmother   . Cancer Maternal Grandfather        brain  . Cancer Paternal Grandmother        lung    Review of Systems  Constitutional: Negative for activity change, chills and fever.  HENT: Negative for trouble swallowing.   Eyes: Negative for visual disturbance.  Respiratory: Negative for chest tightness, shortness of breath and wheezing.   Cardiovascular: Negative for chest pain, palpitations and leg swelling.  Gastrointestinal: Negative for abdominal pain, blood in stool, nausea and vomiting.  Neurological: Negative for dizziness, speech difficulty, weakness and headaches.  Psychiatric/Behavioral: Positive for dysphoric mood and sleep disturbance (sleep eating). Negative for suicidal ideas.     Objective: Office vital signs reviewed. BP 117/68   Pulse 80   Temp 97.7 F (36.5 C) (Temporal)   Ht 5' 3.5" (1.613 m)   Wt 177 lb (80.3 kg)   LMP 07/25/2012   BMI 30.86 kg/m   Physical Examination:  Physical Exam Vitals and nursing note reviewed.  Constitutional:      General: She is not in acute distress.    Appearance: Normal appearance. She is not ill-appearing, toxic-appearing or diaphoretic.  Cardiovascular:     Rate and Rhythm: Normal rate and  regular rhythm.     Heart sounds: Normal heart sounds. No murmur heard.   Pulmonary:     Effort: Pulmonary effort is normal.     Breath sounds: Normal breath sounds.  Musculoskeletal:     Right lower leg: No edema.     Left lower leg: No edema.  Skin:    General: Skin is warm and dry.  Neurological:     General: No focal deficit present.     Mental Status: She is alert and oriented to person, place, and time.  Psychiatric:        Mood and Affect: Mood normal.        Behavior: Behavior  normal.      Results for orders placed or performed during the hospital encounter of 12/19/12  Surgical pcr screen   Specimen: Nasal Mucosa; Nasal Swab  Result Value Ref Range   MRSA, PCR NEGATIVE NEGATIVE   Staphylococcus aureus NEGATIVE NEGATIVE  CBC with Differential  Result Value Ref Range   WBC 8.0 4.0 - 10.5 K/uL   RBC 4.32 3.87 - 5.11 MIL/uL   Hemoglobin 11.9 (L) 12.0 - 15.0 g/dL   HCT 43.3 (L) 36 - 46 %   MCV 83.1 78.0 - 100.0 fL   MCH 27.5 26.0 - 34.0 pg   MCHC 33.1 30.0 - 36.0 g/dL   RDW 29.5 18.8 - 41.6 %   Platelets 228 150 - 400 K/uL   Neutrophils Relative % 73 43 - 77 %   Neutro Abs 5.8 1.7 - 7.7 K/uL   Lymphocytes Relative 18 12 - 46 %   Lymphs Abs 1.5 0.7 - 4.0 K/uL   Monocytes Relative 7 3 - 12 %   Monocytes Absolute 0.6 0.1 - 1.0 K/uL   Eosinophils Relative 2 0 - 5 %   Eosinophils Absolute 0.1 0.0 - 0.7 K/uL   Basophils Relative 0 0 - 1 %   Basophils Absolute 0.0 0.0 - 0.1 K/uL  Comprehensive metabolic panel  Result Value Ref Range   Sodium 135 135 - 145 mEq/L   Potassium 3.6 3.5 - 5.1 mEq/L   Chloride 100 96 - 112 mEq/L   CO2 26 19 - 32 mEq/L   Glucose, Bld 115 (H) 70 - 99 mg/dL   BUN 8 6 - 23 mg/dL   Creatinine, Ser 6.06 0.50 - 1.10 mg/dL   Calcium 8.8 8.4 - 30.1 mg/dL   Total Protein 6.5 6.0 - 8.3 g/dL   Albumin 4.0 3.5 - 5.2 g/dL   AST 601 (H) 0 - 37 U/L   ALT 189 (H) 0 - 35 U/L   Alkaline Phosphatase 123 (H) 39 - 117 U/L   Total Bilirubin 4.1 (H) 0.3 - 1.2 mg/dL   GFR calc non Af Amer >90 >90 mL/min   GFR calc Af Amer >90 >90 mL/min  Urinalysis, Routine w reflex microscopic  Result Value Ref Range   Color, Urine YELLOW YELLOW   APPearance CLEAR CLEAR   Specific Gravity, Urine 1.015 1.005 - 1.030   pH 7.0 5.0 - 8.0   Glucose, UA NEGATIVE NEGATIVE mg/dL   Hgb urine dipstick TRACE (A) NEGATIVE   Bilirubin Urine SMALL (A) NEGATIVE   Ketones, ur NEGATIVE NEGATIVE mg/dL   Protein, ur NEGATIVE NEGATIVE mg/dL   Urobilinogen, UA 0.2 0.0 - 1.0  mg/dL   Nitrite NEGATIVE NEGATIVE   Leukocytes, UA NEGATIVE NEGATIVE  Urine microscopic-add on  Result Value Ref Range   Squamous Epithelial / LPF RARE RARE  WBC, UA 0-2 <3 WBC/hpf   RBC / HPF 3-6 <3 RBC/hpf   Bacteria, UA RARE RARE  Bilirubin, direct  Result Value Ref Range   Bilirubin, Direct 1.9 (H) 0.0 - 0.3 mg/dL  Basic metabolic panel  Result Value Ref Range   Sodium 139 135 - 145 mEq/L   Potassium 4.2 3.5 - 5.1 mEq/L   Chloride 105 96 - 112 mEq/L   CO2 26 19 - 32 mEq/L   Glucose, Bld 110 (H) 70 - 99 mg/dL   BUN 5 (L) 6 - 23 mg/dL   Creatinine, Ser 4.090.79 0.50 - 1.10 mg/dL   Calcium 9.1 8.4 - 81.110.5 mg/dL   GFR calc non Af Amer >90 >90 mL/min   GFR calc Af Amer >90 >90 mL/min  Magnesium  Result Value Ref Range   Magnesium 2.1 1.5 - 2.5 mg/dL  Phosphorus  Result Value Ref Range   Phosphorus 3.0 2.3 - 4.6 mg/dL  CBC  Result Value Ref Range   WBC 5.5 4.0 - 10.5 K/uL   RBC 4.05 3.87 - 5.11 MIL/uL   Hemoglobin 11.2 (L) 12.0 - 15.0 g/dL   HCT 91.434.1 (L) 36 - 46 %   MCV 84.2 78.0 - 100.0 fL   MCH 27.7 26.0 - 34.0 pg   MCHC 32.8 30.0 - 36.0 g/dL   RDW 78.213.9 95.611.5 - 21.315.5 %   Platelets 223 150 - 400 K/uL  Hepatic function panel  Result Value Ref Range   Total Protein 6.1 6.0 - 8.3 g/dL   Albumin 3.5 3.5 - 5.2 g/dL   AST 70 (H) 0 - 37 U/L   ALT 129 (H) 0 - 35 U/L   Alkaline Phosphatase 118 (H) 39 - 117 U/L   Total Bilirubin 4.0 (H) 0.3 - 1.2 mg/dL   Bilirubin, Direct 2.3 (H) 0.0 - 0.3 mg/dL   Indirect Bilirubin 1.7 (H) 0.3 - 0.9 mg/dL  Hepatic function panel  Result Value Ref Range   Total Protein 6.7 6.0 - 8.3 g/dL   Albumin 3.6 3.5 - 5.2 g/dL   AST 43 (H) 0 - 37 U/L   ALT 101 (H) 0 - 35 U/L   Alkaline Phosphatase 132 (H) 39 - 117 U/L   Total Bilirubin 2.9 (H) 0.3 - 1.2 mg/dL   Bilirubin, Direct 1.4 (H) 0.0 - 0.3 mg/dL   Indirect Bilirubin 1.5 (H) 0.3 - 0.9 mg/dL     Assessment/ Plan: Adela LankJacqueline was seen today for weight gain.  Diagnoses and all orders for this  visit:  Obesity (BMI 30-39.9) Weight gain Diet and exercise handout given. Restart phentermine. Take 1/2 tablets for 3-4 days, then take 1 tablet daily.  -     phentermine 37.5 MG capsule; Take 1 capsule (37.5 mg total) by mouth every morning.  Sleep related eating disorder -     Ambulatory referral to Psychology  Depression, recurrent (HCC) Anxiety PHQ is 16, GAD is 14. Restart Wellbutrin. Start with 1 tablet daily, may increase to 2 tablets after 3 days if needed.   -     Ambulatory referral to Psychology -     buPROPion (WELLBUTRIN XL) 150 MG 24 hr tablet; Take 1 tablet daily. After 3 days, may take 2 tablets daily.  Follow up in 3 months. She is currently self pay but plan to apply for Medicaid. Will obtain lab work at next visit when she hopefully has insurance.   The above assessment and management plan was discussed with the patient. The patient  verbalized understanding of and has agreed to the management plan. Patient is aware to call the clinic if symptoms persist or worsen. Patient is aware when to return to the clinic for a follow-up visit. Patient educated on when it is appropriate to go to the emergency department.   Marjorie Smolder, FNP-C [facility] [address] [place], [place] [address] [phone]

## 2020-02-20 ENCOUNTER — Telehealth: Payer: Self-pay | Admitting: Family Medicine

## 2020-05-14 ENCOUNTER — Other Ambulatory Visit: Payer: Self-pay

## 2020-05-14 ENCOUNTER — Ambulatory Visit (INDEPENDENT_AMBULATORY_CARE_PROVIDER_SITE_OTHER): Payer: 59 | Admitting: Family Medicine

## 2020-05-14 ENCOUNTER — Encounter: Payer: Self-pay | Admitting: Family Medicine

## 2020-05-14 VITALS — BP 122/69 | HR 98 | Temp 97.9°F | Ht 63.5 in | Wt 164.0 lb

## 2020-05-14 DIAGNOSIS — F339 Major depressive disorder, recurrent, unspecified: Secondary | ICD-10-CM | POA: Insufficient documentation

## 2020-05-14 DIAGNOSIS — E663 Overweight: Secondary | ICD-10-CM | POA: Insufficient documentation

## 2020-05-14 MED ORDER — PHENTERMINE HCL 37.5 MG PO CAPS: 37.5000 mg | ORAL_CAPSULE | ORAL | 2 refills | Status: DC

## 2020-05-14 MED ORDER — BUPROPION HCL ER (XL) 300 MG PO TB24: ORAL_TABLET | ORAL | 3 refills | Status: DC

## 2020-05-14 NOTE — Progress Notes (Signed)
Established Patient Office Visit  Subjective:  Patient ID: Karen Gillespie, female    DOB: June 13, 1975  Age: 45 y.o. MRN: [id number]  CC:  Chief Complaint  Patient presents with  . Depression  . Obesity    HPI Karen Gillespie presents for follow up and depression and weight management.  1. Depression She has been taking Wellbutrin 150 mg daily. She reports that she has been doing well on this without side effect. She does reports that she has been moody lately.   Depression screen [facility] 2/9 05/14/2020 02/15/2020 04/22/2017  Decreased Interest '1 3 1  ' Down, Depressed, Hopeless 0 2 1  PHQ - 2 Score '1 5 2  ' Altered sleeping '2 3 2  ' Tired, decreased energy 1 1 0  Change in appetite '1 3 2  ' Feeling bad or failure about yourself  0 0 0  Trouble concentrating 0 1 0  Moving slowly or fidgety/restless 0 3 0  Suicidal thoughts 0 0 0  PHQ-9 Score '5 16 6  ' Difficult doing work/chores Somewhat difficult Somewhat difficult -   2. Weight management She has been taking phentermine 37.5 mg. She had a break this month. She denies side effects. She has lost 13 lbs. She has been working to incorporate exercise.   Past Medical History:  Diagnosis Date  . Nail fungus    shingles    Past Surgical History:  Procedure Laterality Date  . CESAREAN SECTION     x 4  . CHOLECYSTECTOMY N/A 12/21/2012   Procedure: LAPAROSCOPIC CHOLECYSTECTOMY WITH INTRAOPERATIVE CHOLANGIOGRAM;  Surgeon: Jamesetta So, MD;  Location: [facility];  Service: General;  Laterality: N/A;  . Dilation and cutterage    . LEAP     . TUBAL LIGATION      Family History  Problem Relation Age of Onset  . Diabetes Mother   . Hypertension Mother   . Heart disease Maternal Grandmother   . Heart failure Maternal Grandmother   . Cancer Maternal Grandfather        brain  . Cancer Paternal Grandmother        lung    Social History   Socioeconomic History  . Marital status: Married    Spouse name: Not on file  . Number of  children: Not on file  . Years of education: Not on file  . Highest education level: Not on file  Occupational History  . Not on file  Tobacco Use  . Smoking status: Current Every Day Smoker    Packs/day: 0.50    Years: 15.00    Pack years: 7.50    Types: Cigarettes  . Smokeless tobacco: Never Used  Vaping Use  . Vaping Use: Never used  Substance and Sexual Activity  . Alcohol use: No  . Drug use: No  . Sexual activity: Yes    Birth control/protection: Surgical  Other Topics Concern  . Not on file  Social History Narrative  . Not on file   Social Determinants of Health   Financial Resource Strain: Not on file  Food Insecurity: Not on file  Transportation Needs: Not on file  Physical Activity: Not on file  Stress: Not on file  Social Connections: Not on file  Intimate Partner Violence: Not on file    Outpatient Medications Prior to Visit  Medication Sig Dispense Refill  . buPROPion (WELLBUTRIN XL) 150 MG 24 hr tablet Take 1 tablet daily. After 3 days, may take 2 tablets daily. 30 tablet 1  .  phentermine 37.5 MG capsule Take 1 capsule (37.5 mg total) by mouth every morning. 30 capsule 2   No facility-administered medications prior to visit.    Allergies  Allergen Reactions  . Penicillins Shortness Of Breath, Swelling and Rash  . Lamisil [Terbinafine] Nausea Only  . Codeine Swelling and Rash  . Latex Swelling and Rash    ROS Review of Systems Negative unless specially indicated above in HPI.   Objective:    Physical Exam Vitals and nursing note reviewed.  Constitutional:      General: She is not in acute distress.    Appearance: She is well-developed. She is not ill-appearing, toxic-appearing or diaphoretic.  HENT:     Nose: Nose normal.  Neck:     Thyroid: No thyromegaly.     Vascular: No carotid bruit or JVD.     Trachea: Trachea normal.  Cardiovascular:     Rate and Rhythm: Normal rate and regular rhythm.     Heart sounds: Normal heart sounds. No  murmur heard. No friction rub. No gallop.   Pulmonary:     Effort: Pulmonary effort is normal.     Breath sounds: Normal breath sounds.  Abdominal:     General: Bowel sounds are normal. There is no distension.     Palpations: Abdomen is soft. There is no mass.     Tenderness: There is no abdominal tenderness.  Musculoskeletal:        General: Normal range of motion.     Cervical back: Full passive range of motion without pain, normal range of motion and neck supple.  Lymphadenopathy:     Cervical: No cervical adenopathy.  Skin:    General: Skin is warm and dry.  Neurological:     General: No focal deficit present.     Mental Status: She is alert and oriented to person, place, and time.     Deep Tendon Reflexes: Reflexes are normal and symmetric.  Psychiatric:        Mood and Affect: Mood normal.        Behavior: Behavior normal.        Thought Content: Thought content normal.        Judgment: Judgment normal.     BP 122/69   Pulse 98   Temp 97.9 F (36.6 C) (Temporal)   Ht 5' 3.5" (1.613 m)   Wt 164 lb (74.4 kg)   LMP 07/25/2012   BMI 28.60 kg/m  Wt Readings from Last 3 Encounters:  05/14/20 164 lb (74.4 kg)  02/15/20 177 lb (80.3 kg)  02/16/19 163 lb (73.9 kg)     Health Maintenance Due  Topic Date Due  . Hepatitis C Screening  Never done  . HIV Screening  Never done  . PAP SMEAR-Modifier  Never done    There are no preventive care reminders to display for this patient.  No results found for: TSH Lab Results  Component Value Date   WBC 5.5 12/20/2012   HGB 11.2 (L) 12/20/2012   HCT 34.1 (L) 12/20/2012   MCV 84.2 12/20/2012   PLT 223 12/20/2012   Lab Results  Component Value Date   NA 139 12/20/2012   K 4.2 12/20/2012   CO2 26 12/20/2012   GLUCOSE 110 (H) 12/20/2012   BUN 5 (L) 12/20/2012   CREATININE 0.79 12/20/2012   BILITOT 2.9 (H) 12/21/2012   ALKPHOS 132 (H) 12/21/2012   AST 43 (H) 12/21/2012   ALT 101 (H) 12/21/2012   PROT 6.7 12/21/2012  ALBUMIN 3.6 12/21/2012   CALCIUM 9.1 12/20/2012   No results found for: CHOL No results found for: HDL No results found for: LDLCALC No results found for: TRIG No results found for: CHOLHDL No results found for: HGBA1C    Assessment & Plan:   Charis was seen today for depression and obesity.  Diagnoses and all orders for this visit:  Depression, recurrent ([place]) PHQ 9 score of 5 today, much improved from previous. Increase to 300 mg daily.  -     buPROPion (WELLBUTRIN XL) 300 MG 24 hr tablet; Take 1 tablet daily.  Overweight (BMI 25.0-29.9) 13 lb weight loss. She will return for fasting lab work. Will continue phentermine for another round.  -     phentermine 37.5 MG capsule; Take 1 capsule (37.5 mg total) by mouth every morning. -     CBC with Differential/Platelet; Future -     CMP14+EGFR; Future -     Lipid panel; Future -     Thyroid Panel With TSH; Future    Follow-up: Return in about 4 months (around 09/13/2020) for CPE with pap, lab work.   The patient indicates understanding of these issues and agrees with the plan.    Gwenlyn Perking, FNP

## 2020-05-14 NOTE — Patient Instructions (Signed)

## 2020-09-13 ENCOUNTER — Encounter: Payer: Self-pay | Admitting: Family Medicine

## 2020-09-13 ENCOUNTER — Ambulatory Visit: Payer: 59 | Admitting: Family Medicine

## 2020-09-21 ENCOUNTER — Other Ambulatory Visit: Payer: Self-pay

## 2020-09-21 ENCOUNTER — Encounter: Payer: Self-pay | Admitting: Family Medicine

## 2020-09-21 ENCOUNTER — Other Ambulatory Visit (HOSPITAL_COMMUNITY)
Admission: RE | Admit: 2020-09-21 | Discharge: 2020-09-21 | Disposition: A | Discharge status: Home / Self Care | Payer: 59 | Source: Ambulatory Visit | Attending: Family Medicine | Admitting: Family Medicine

## 2020-09-21 ENCOUNTER — Ambulatory Visit (INDEPENDENT_AMBULATORY_CARE_PROVIDER_SITE_OTHER): Payer: 59 | Admitting: Family Medicine

## 2020-09-21 VITALS — BP 121/78 | HR 100 | Temp 97.8°F | Ht 63.5 in | Wt 154.2 lb

## 2020-09-21 DIAGNOSIS — Z01419 Encounter for gynecological examination (general) (routine) without abnormal findings: Secondary | ICD-10-CM | POA: Diagnosis present

## 2020-09-21 DIAGNOSIS — Z1159 Encounter for screening for other viral diseases: Secondary | ICD-10-CM | POA: Diagnosis not present

## 2020-09-21 DIAGNOSIS — Z124 Encounter for screening for malignant neoplasm of cervix: Secondary | ICD-10-CM

## 2020-09-21 DIAGNOSIS — Z114 Encounter for screening for human immunodeficiency virus [HIV]: Secondary | ICD-10-CM

## 2020-09-21 DIAGNOSIS — Z01411 Encounter for gynecological examination (general) (routine) with abnormal findings: Secondary | ICD-10-CM

## 2020-09-21 DIAGNOSIS — F419 Anxiety disorder, unspecified: Secondary | ICD-10-CM

## 2020-09-21 DIAGNOSIS — Z23 Encounter for immunization: Secondary | ICD-10-CM | POA: Diagnosis not present

## 2020-09-21 DIAGNOSIS — F339 Major depressive disorder, recurrent, unspecified: Secondary | ICD-10-CM

## 2020-09-21 NOTE — Patient Instructions (Signed)
Health Maintenance, Female Adopting a healthy lifestyle and getting preventive care are important in promoting health and wellness. Ask your health care provider about: The right schedule for you to have regular tests and exams. Things you can do on your own to prevent diseases and keep yourself healthy. What should I know about diet, weight, and exercise? Eat a healthy diet  Eat a diet that includes plenty of vegetables, fruits, low-fat dairy products, and lean protein. Do not eat a lot of foods that are high in solid fats, added sugars, or sodium.  Maintain a healthy weight Body mass index (BMI) is used to identify weight problems. It estimates body fat based on height and weight. Your health care provider can help determineyour BMI and help you achieve or maintain a healthy weight. Get regular exercise Get regular exercise. This is one of the most important things you can do for your health. Most adults should: Exercise for at least 150 minutes each week. The exercise should increase your heart rate and make you sweat (moderate-intensity exercise). Do strengthening exercises at least twice a week. This is in addition to the moderate-intensity exercise. Spend less time sitting. Even light physical activity can be beneficial. Watch cholesterol and blood lipids Have your blood tested for lipids and cholesterol at 44 years of age, then havethis test every 5 years. Have your cholesterol levels checked more often if: Your lipid or cholesterol levels are high. You are older than 44 years of age. You are at high risk for heart disease. What should I know about cancer screening? Depending on your health history and family history, you may need to have cancer screening at various ages. This may include screening for: Breast cancer. Cervical cancer. Colorectal cancer. Skin cancer. Lung cancer. What should I know about heart disease, diabetes, and high blood pressure? Blood pressure and heart  disease High blood pressure causes heart disease and increases the risk of stroke. This is more likely to develop in people who have high blood pressure readings, are of African descent, or are overweight. Have your blood pressure checked: Every 3-5 years if you are 18-39 years of age. Every year if you are 40 years old or older. Diabetes Have regular diabetes screenings. This checks your fasting blood sugar level. Have the screening done: Once every three years after age 40 if you are at a normal weight and have a low risk for diabetes. More often and at a younger age if you are overweight or have a high risk for diabetes. What should I know about preventing infection? Hepatitis B If you have a higher risk for hepatitis B, you should be screened for this virus. Talk with your health care provider to find out if you are at risk forhepatitis B infection. Hepatitis C Testing is recommended for: Everyone born from 1945 through 1965. Anyone with known risk factors for hepatitis C. Sexually transmitted infections (STIs) Get screened for STIs, including gonorrhea and chlamydia, if: You are sexually active and are younger than 44 years of age. You are older than 44 years of age and your health care provider tells you that you are at risk for this type of infection. Your sexual activity has changed since you were last screened, and you are at increased risk for chlamydia or gonorrhea. Ask your health care provider if you are at risk. Ask your health care provider about whether you are at high risk for HIV. Your health care provider may recommend a prescription medicine to help   prevent HIV infection. If you choose to take medicine to prevent HIV, you should first get tested for HIV. You should then be tested every 3 months for as long as you are taking the medicine. Pregnancy If you are about to stop having your period (premenopausal) and you may become pregnant, seek counseling before you get  pregnant. Take 400 to 800 micrograms (mcg) of folic acid every day if you become pregnant. Ask for birth control (contraception) if you want to prevent pregnancy. Osteoporosis and menopause Osteoporosis is a disease in which the bones lose minerals and strength with aging. This can result in bone fractures. If you are 65 years old or older, or if you are at risk for osteoporosis and fractures, ask your health care provider if you should: Be screened for bone loss. Take a calcium or vitamin D supplement to lower your risk of fractures. Be given hormone replacement therapy (HRT) to treat symptoms of menopause. Follow these instructions at home: Lifestyle Do not use any products that contain nicotine or tobacco, such as cigarettes, e-cigarettes, and chewing tobacco. If you need help quitting, ask your health care provider. Do not use street drugs. Do not share needles. Ask your health care provider for help if you need support or information about quitting drugs. Alcohol use Do not drink alcohol if: Your health care provider tells you not to drink. You are pregnant, may be pregnant, or are planning to become pregnant. If you drink alcohol: Limit how much you use to 0-1 drink a day. Limit intake if you are breastfeeding. Be aware of how much alcohol is in your drink. In the [place] one drink equals one 12 oz bottle of beer (355 mL), one 5 oz glass of wine (148 mL), or one 1 oz glass of hard liquor (44 mL). General instructions Schedule regular health, dental, and eye exams. Stay current with your vaccines. Tell your health care provider if: You often feel depressed. You have ever been abused or do not feel safe at home. Summary Adopting a healthy lifestyle and getting preventive care are important in promoting health and wellness. Follow your health care provider's instructions about healthy diet, exercising, and getting tested or screened for diseases. Follow your health care provider's  instructions on monitoring your cholesterol and blood pressure. This information is not intended to replace advice given to you by your health care provider. Make sure you discuss any questions you have with your healthcare provider. Document Revised: 02/17/2018 Document Reviewed: 02/17/2018 Elsevier Patient Education  2022 [employer]  

## 2020-09-21 NOTE — Progress Notes (Signed)
Karen Gillespie is a 45 y.o. female presents to office today for annual physical exam examination.    Concerns today include: 1. None  Occupation: stay at home mom, Marital status: married, Substance use: denies Diet: eating a well balanced diet, Exercise: has been working out with  Last eye exam: within the last year Last dental exam: within a few months Last pap smear: unsure, years ago  Refills needed today: none   Depression screen [facility] 2/9 09/21/2020 05/14/2020 02/15/2020  Decreased Interest 0 1 3  Down, Depressed, Hopeless 0 0 2  PHQ - 2 Score 0 1 5  Altered sleeping 0 2 3  Tired, decreased energy _0 Change in appetite _1 Feeling bad or failure about yourself  0 0 0  Trouble concentrating 0 0 1  Moving slowly or fidgety/restless 0 0 3  Suicidal thoughts 0 0 0  PHQ-9 Score _2 Difficult doing work/chores Not difficult at all Somewhat difficult Somewhat difficult   GAD 7 : Generalized Anxiety Score 09/21/2020 02/15/2020  Nervous, Anxious, on Edge 1 1  Control/stop worrying 1 3  Worry too much - different things 1 3  Trouble relaxing 0 3  Restless 0 3  Easily annoyed or irritable 0 1  Afraid - awful might happen 0 0  Total GAD 7 Score 3 14  Anxiety Difficulty Not difficult at all Somewhat difficult      Past Medical History:  Diagnosis Date   Nail fungus    shingles   Social History   Socioeconomic History   Marital status: Married    Spouse name: Not on file   Number of children: Not on file   Years of education: Not on file   Highest education level: Not on file  Occupational History   Not on file  Tobacco Use   Smoking status: Every Day    Packs/day: 0.50    Years: 15.00    Pack years: 7.50    Types: Cigarettes   Smokeless tobacco: Never  Vaping Use   Vaping Use: Never used  Substance and Sexual Activity   Alcohol use: No   Drug use: No   Sexual activity: Yes    Birth control/protection: Surgical  Other Topics Concern   Not on  file  Social History Narrative   Not on file   Social Determinants of Health   Financial Resource Strain: Not on file  Food Insecurity: Not on file  Transportation Needs: Not on file  Physical Activity: Not on file  Stress: Not on file  Social Connections: Not on file  Intimate Partner Violence: Not on file   Past Surgical History:  Procedure Laterality Date   CESAREAN SECTION     x 4   CHOLECYSTECTOMY N/A 12/21/2012   Procedure: LAPAROSCOPIC CHOLECYSTECTOMY WITH INTRAOPERATIVE CHOLANGIOGRAM;  Surgeon: Jamesetta So, MD;  Location: [facility];  Service: General;  Laterality: N/A;   Dilation and cutterage     LEAP      TUBAL LIGATION     Family History  Problem Relation Age of Onset   Diabetes Mother    Hypertension Mother    Heart disease Maternal Grandmother    Heart failure Maternal Grandmother    Cancer Maternal Grandfather        brain   Cancer Paternal Grandmother        lung    Current Outpatient Medications:    buPROPion (WELLBUTRIN XL) 300 MG 24 hr  tablet, Take 1 tablet daily., Disp: 90 tablet, Rfl: 3   phentermine 37.5 MG capsule, Take 1 capsule (37.5 mg total) by mouth every morning., Disp: 30 capsule, Rfl: 2  Allergies  Allergen Reactions   Penicillins Shortness Of Breath, Swelling and Rash   Lamisil [Terbinafine] Nausea Only   Codeine Swelling and Rash   Latex Swelling and Rash     ROS: Review of Systems Pertinent items noted in HPI and remainder of comprehensive ROS otherwise negative.    Physical exam BP 121/78   Pulse 100   Temp 97.8 F (36.6 C) (Temporal)   Ht 5' 3.5" (1.613 m)   Wt 154 lb 3.2 oz (69.9 kg)   LMP 07/25/2012   BMI 26.89 kg/m  General appearance: alert, cooperative, and no distress Head: Normocephalic, without obvious abnormality, atraumatic Eyes: conjunctivae/corneas clear. PERRL, EOM's intact. Fundi benign. Ears: normal TM's and external ear canals both ears Nose: Nares normal. Septum midline. Mucosa normal. No drainage or  sinus tenderness. Throat: lips, mucosa, and tongue normal; teeth and gums normal Neck: no adenopathy, no carotid bruit, no JVD, supple, symmetrical, trachea midline, and thyroid not enlarged, symmetric, no tenderness/mass/nodules Lungs: clear to auscultation bilaterally Breasts: normal appearance, no masses or tenderness Heart: regular rate and rhythm, S1, S2 normal, no murmur, click, rub or gallop Abdomen: soft, non-tender; bowel sounds normal; no masses,  no organomegaly Pelvic: cervix normal in appearance, external genitalia normal, no adnexal masses or tenderness, no cervical motion tenderness, rectovaginal septum normal, uterus normal size, shape, and consistency, and vagina normal without discharge Extremities: extremities normal, atraumatic, no cyanosis or edema Pulses: 2+ and symmetric Skin: Skin color, texture, turgor normal. No rashes or lesions Lymph nodes: Cervical, supraclavicular, and axillary nodes normal. Neurologic: Alert and oriented X 3, normal strength and tone. Normal symmetric reflexes. Normal coordination and gait    Assessment/ Plan: Karen Gillespie here for annual physical exam.   No problem-specific Assessment & Plan notes found for this encounter.  Diagnoses and all orders for this visit:  Well woman exam with routine gynecological exam Labs pending. Pap today.  -     CBC with Differential/Platelet -     CMP14+EGFR -     Lipid panel -     TSH -     Vitamin B12 -     VITAMIN D 25 Hydroxy (Vit-D Deficiency, Fractures) -     Cytology - PAP  Cervical cancer screening -     Cytology - PAP  Need for vaccination Vaccine today in office. -     Tdap vaccine greater than or equal to 7yo IM  Need for hepatitis C screening test -     Hepatitis C antibody  Encounter for screening for HIV -     HIV Antibody (routine testing w rflx)  Depression, recurrent ([place]) Well controlled on current regimen.   Anxiety Well controlled on current regimen.     Counseled on healthy lifestyle choices, including diet (rich in fruits, vegetables and lean meats and low in salt and simple carbohydrates) and exercise (at least 30 minutes of moderate physical activity daily).  Patient to follow up in 1 year for annual exam or sooner if needed.  Return in about 6 months (around 03/24/2021) for chronic follow up.  The above assessment and management plan was discussed with the patient. The patient verbalized understanding of and has agreed to the management plan. Patient is aware to call the clinic if symptoms persist or worsen. Patient is aware  when to return to the clinic for a follow-up visit. Patient educated on when it is appropriate to go to the emergency department.   Marjorie Smolder, FNP-C [facility] [address] [place], [place] [address] [phone]

## 2020-09-22 LAB — CBC WITH DIFFERENTIAL/PLATELET
Basophils Absolute: 0 10*3/uL (ref 0.0–0.2)
Basos: 1 %
EOS (ABSOLUTE): 0.1 10*3/uL (ref 0.0–0.4)
Eos: 2 %
Hematocrit: 31.3 % — ABNORMAL LOW (ref 34.0–46.6)
Hemoglobin: 9.2 g/dL — ABNORMAL LOW (ref 11.1–15.9)
Immature Grans (Abs): 0 10*3/uL (ref 0.0–0.1)
Immature Granulocytes: 0 %
Lymphocytes Absolute: 1.9 10*3/uL (ref 0.7–3.1)
Lymphs: 38 %
MCH: 20.9 pg — ABNORMAL LOW (ref 26.6–33.0)
MCHC: 29.4 g/dL — ABNORMAL LOW (ref 31.5–35.7)
MCV: 71 fL — ABNORMAL LOW (ref 79–97)
Monocytes Absolute: 0.5 10*3/uL (ref 0.1–0.9)
Monocytes: 10 %
Neutrophils Absolute: 2.5 10*3/uL (ref 1.4–7.0)
Neutrophils: 49 %
Platelets: 409 10*3/uL (ref 150–450)
RBC: 4.4 x10E6/uL (ref 3.77–5.28)
RDW: 18.4 % — ABNORMAL HIGH (ref 11.7–15.4)
WBC: 5.1 10*3/uL (ref 3.4–10.8)

## 2020-09-22 LAB — HIV ANTIBODY (ROUTINE TESTING W REFLEX): HIV Screen 4th Generation wRfx: NONREACTIVE

## 2020-09-22 LAB — VITAMIN D 25 HYDROXY (VIT D DEFICIENCY, FRACTURES): Vit D, 25-Hydroxy: 38.2 ng/mL (ref 30.0–100.0)

## 2020-09-22 LAB — CMP14+EGFR
ALT: 13 IU/L (ref 0–32)
AST: 16 IU/L (ref 0–40)
Albumin/Globulin Ratio: 2 (ref 1.2–2.2)
Albumin: 4.6 g/dL (ref 3.8–4.8)
Alkaline Phosphatase: 70 IU/L (ref 44–121)
BUN/Creatinine Ratio: 11 (ref 9–23)
BUN: 12 mg/dL (ref 6–24)
Bilirubin Total: 0.7 mg/dL (ref 0.0–1.2)
CO2: 24 mmol/L (ref 20–29)
Calcium: 9.6 mg/dL (ref 8.7–10.2)
Chloride: 97 mmol/L (ref 96–106)
Creatinine, Ser: 1.06 mg/dL — ABNORMAL HIGH (ref 0.57–1.00)
Globulin, Total: 2.3 g/dL (ref 1.5–4.5)
Glucose: 83 mg/dL (ref 65–99)
Potassium: 4.2 mmol/L (ref 3.5–5.2)
Sodium: 134 mmol/L (ref 134–144)
Total Protein: 6.9 g/dL (ref 6.0–8.5)
eGFR: 67 mL/min/{1.73_m2} (ref >59)

## 2020-09-22 LAB — HEPATITIS C ANTIBODY: Hep C Virus Ab: <0.1 s/co ratio (ref 0.0–0.9)

## 2020-09-22 LAB — LIPID PANEL
Chol/HDL Ratio: 3.9 ratio (ref 0.0–4.4)
Cholesterol, Total: 209 mg/dL — ABNORMAL HIGH (ref 100–199)
HDL: 54 mg/dL (ref >39)
LDL Chol Calc (NIH): 139 mg/dL — ABNORMAL HIGH (ref 0–99)
Triglycerides: 92 mg/dL (ref 0–149)
VLDL Cholesterol Cal: 16 mg/dL (ref 5–40)

## 2020-09-22 LAB — VITAMIN B12: Vitamin B-12: 318 pg/mL (ref 232–1245)

## 2020-09-22 LAB — TSH: TSH: 1.17 u[IU]/mL (ref 0.450–4.500)

## 2020-09-26 LAB — CYTOLOGY - PAP
Chlamydia: NEGATIVE
Comment: NEGATIVE
Comment: NEGATIVE
Comment: NORMAL
Diagnosis: NEGATIVE
Neisseria Gonorrhea: NEGATIVE
Trichomonas: NEGATIVE

## 2020-09-26 NOTE — Progress Notes (Signed)
R/c about labs 

## 2021-01-08 ENCOUNTER — Encounter: Payer: Self-pay | Admitting: Nurse Practitioner

## 2021-01-08 ENCOUNTER — Ambulatory Visit (INDEPENDENT_AMBULATORY_CARE_PROVIDER_SITE_OTHER): Payer: 59 | Admitting: Nurse Practitioner

## 2021-01-08 DIAGNOSIS — J069 Acute upper respiratory infection, unspecified: Secondary | ICD-10-CM

## 2021-01-08 LAB — VERITOR FLU A/B WAIVED
Influenza A: POSITIVE — AB
Influenza B: NEGATIVE

## 2021-01-08 MED ORDER — DOXYCYCLINE HYCLATE 100 MG PO TABS: 100.0000 mg | ORAL_TABLET | Freq: Two times a day (BID) | ORAL | 0 refills | Status: DC

## 2021-01-08 MED ORDER — OSELTAMIVIR PHOSPHATE 75 MG PO CAPS: 75.0000 mg | ORAL_CAPSULE | Freq: Every day | ORAL | 0 refills | Status: DC

## 2021-01-08 NOTE — Assessment & Plan Note (Signed)
Take meds as prescribed - Use a cool mist humidifier  -Use saline nose sprays frequently -Force fluids -For fever or aches or pains- take Tylenol or ibuprofen. -Flu swab completed results pending. -If symptoms do not improve, she may need to be COVID tested to rule this out Follow up with worsening unresolved symptoms

## 2021-01-08 NOTE — Patient Instructions (Signed)

## 2021-01-08 NOTE — Addendum Note (Signed)
Addended by: Daryll Drown on: 01/08/2021 11:03 AM   Modules accepted: Orders

## 2021-01-08 NOTE — Progress Notes (Signed)
   Virtual Visit  Note Due to COVID-19 pandemic this visit was conducted virtually. This visit type was conducted due to national recommendations for restrictions regarding the COVID-19 Pandemic (e.g. social distancing, sheltering in place) in an effort to limit this patient's exposure and mitigate transmission in our community. All issues noted in this document were discussed and addressed.  A physical exam was not performed with this format.  I connected with Karen Gillespie on 01/08/21 at 10:25 AM by telephone and verified that I am speaking with the correct person using two identifiers. Karen Gillespie is currently located at home during visit. The provider, Daryll Drown, NP is located in their office at time of visit.  I discussed the limitations, risks, security and privacy concerns of performing an evaluation and management service by telephone and the availability of in person appointments. I also discussed with the patient that there may be a patient responsible charge related to this service. The patient expressed understanding and agreed to proceed.   History and Present Illness:  URI  This is a new problem. Episode onset: in the past 3-4 days. The problem has been gradually worsening. There has been no fever. Associated symptoms include congestion, coughing, nausea and sinus pain. Pertinent negatives include no rash or sore throat. She has tried nothing for the symptoms.     Review of Systems  Constitutional:  Positive for chills. Negative for malaise/fatigue.  HENT:  Positive for congestion and sinus pain. Negative for sore throat.   Respiratory:  Positive for cough. Negative for shortness of breath.   Gastrointestinal:  Positive for nausea.  Skin:  Negative for rash.  All other systems reviewed and are negative.   Observations/Objective: Televisit patient not in distress.  Assessment and Plan: Take meds as prescribed - Use a cool mist humidifier  -Use saline nose  sprays frequently -Force fluids -For fever or aches or pains- take Tylenol or ibuprofen. -Flu swab completed results pending. -If symptoms do not improve, she may need to be COVID tested to rule this out Follow up with worsening unresolved symptoms   Follow Up Instructions: Follow-up with worsening unresolved symptoms.    I discussed the assessment and treatment plan with the patient. The patient was provided an opportunity to ask questions and all were answered. The patient agreed with the plan and demonstrated an understanding of the instructions.   The patient was advised to call back or seek an in-person evaluation if the symptoms worsen or if the condition fails to improve as anticipated.  The above assessment and management plan was discussed with the patient. The patient verbalized understanding of and has agreed to the management plan. Patient is aware to call the clinic if symptoms persist or worsen. Patient is aware when to return to the clinic for a follow-up visit. Patient educated on when it is appropriate to go to the emergency department.   Time call ended: 10:32 AM  I provided 7 minutes of  non face-to-face time during this encounter.    Daryll Drown, NP

## 2021-03-26 ENCOUNTER — Encounter: Payer: Self-pay | Admitting: Family Medicine

## 2021-03-26 ENCOUNTER — Ambulatory Visit (INDEPENDENT_AMBULATORY_CARE_PROVIDER_SITE_OTHER): Payer: 59 | Admitting: Family Medicine

## 2021-03-26 VITALS — BP 112/66 | HR 83 | Temp 98.3°F | Ht 64.0 in | Wt 182.0 lb

## 2021-03-26 DIAGNOSIS — R202 Paresthesia of skin: Secondary | ICD-10-CM | POA: Diagnosis not present

## 2021-03-26 DIAGNOSIS — Z6831 Body mass index (BMI) 31.0-31.9, adult: Secondary | ICD-10-CM | POA: Diagnosis not present

## 2021-03-26 DIAGNOSIS — E7800 Pure hypercholesterolemia, unspecified: Secondary | ICD-10-CM

## 2021-03-26 DIAGNOSIS — D500 Iron deficiency anemia secondary to blood loss (chronic): Secondary | ICD-10-CM

## 2021-03-26 NOTE — Progress Notes (Signed)
Subjective:  Patient ID: Karen Gillespie, female    DOB: Oct 31, 1975, 46 y.o.   MRN: [id number]  Patient Care Team: Gwenlyn Perking, FNP as PCP - General (Family Medicine)   Chief Complaint:  6 month follow up (Iron deficiency, painful feet)   HPI: Karen Gillespie is a 46 y.o. female presenting on 03/26/2021 for 6 month follow up (Iron deficiency, painful feet)   Patient 39-month follow-up of iron deficiency anemia and bilateral foot numbness/tingling sensations.  At last visit her hemoglobin and hematocrit were noted to be low.  She was instructed to start on a multivitamin with iron at home and has done this.  States she still has some fatigue and noted pallor.  She also has bilateral feet pins-and-needles sensations.  States the sensations come and go. No reported injuries.  At last visit LDL was also noted to be elevated. She does not follow a strict diet, no regular exercise routine.    Relevant past medical, surgical, family, and social history reviewed and updated as indicated.  Allergies and medications reviewed and updated. Data reviewed: Chart in Epic.   Past Medical History:  Diagnosis Date   Nail fungus    shingles    Past Surgical History:  Procedure Laterality Date   CESAREAN SECTION     x 4   CHOLECYSTECTOMY N/A 12/21/2012   Procedure: LAPAROSCOPIC CHOLECYSTECTOMY WITH INTRAOPERATIVE CHOLANGIOGRAM;  Surgeon: Jamesetta So, MD;  Location: [facility];  Service: General;  Laterality: N/A;   Dilation and cutterage     LEAP      TUBAL LIGATION      Social History   Socioeconomic History   Marital status: Married    Spouse name: Not on file   Number of children: Not on file   Years of education: Not on file   Highest education level: Not on file  Occupational History   Not on file  Tobacco Use   Smoking status: Every Day    Packs/day: 0.50    Years: 15.00    Pack years: 7.50    Types: Cigarettes   Smokeless tobacco: Never  Vaping Use   Vaping  Use: Never used  Substance and Sexual Activity   Alcohol use: No   Drug use: No   Sexual activity: Yes    Birth control/protection: Surgical  Other Topics Concern   Not on file  Social History Narrative   Not on file   Social Determinants of Health   Financial Resource Strain: Not on file  Food Insecurity: Not on file  Transportation Needs: Not on file  Physical Activity: Not on file  Stress: Not on file  Social Connections: Not on file  Intimate Partner Violence: Not on file    Outpatient Encounter Medications as of 03/26/2021  Medication Sig   [DISCONTINUED] buPROPion (WELLBUTRIN XL) 300 MG 24 hr tablet Take 1 tablet daily.   [DISCONTINUED] oseltamivir (TAMIFLU) 75 MG capsule Take 1 capsule (75 mg total) by mouth daily.   No facility-administered encounter medications on file as of 03/26/2021.    Allergies  Allergen Reactions   Penicillins Shortness Of Breath, Swelling and Rash   Lamisil [Terbinafine] Nausea Only   Codeine Swelling and Rash   Latex Swelling and Rash    Review of Systems  Constitutional:  Negative for activity change, appetite change, chills, diaphoresis, fatigue, fever and unexpected weight change.  HENT: Negative.    Eyes: Negative.  Negative for photophobia and visual disturbance.  Respiratory:  Negative for cough, chest tightness and shortness of breath.   Cardiovascular:  Negative for chest pain, palpitations and leg swelling.  Gastrointestinal:  Negative for abdominal distention, abdominal pain, anal bleeding, blood in stool, constipation, diarrhea, nausea, rectal pain and vomiting.  Endocrine: Negative.   Genitourinary:  Negative for decreased urine volume, difficulty urinating, dysuria, frequency, hematuria, menstrual problem, urgency and vaginal bleeding.  Musculoskeletal:  Negative for arthralgias and myalgias.  Skin:  Positive for pallor. Negative for color change and rash.  Allergic/Immunologic: Negative.   Neurological:  Negative for  dizziness, tremors, seizures, syncope, facial asymmetry, speech difficulty, weakness, light-headedness, numbness and headaches.       Numbness and tingling in bilateral feet  Hematological: Negative.   Psychiatric/Behavioral:  Negative for confusion, hallucinations, sleep disturbance and suicidal ideas.   All other systems reviewed and are negative.      Objective:  BP 112/66    Pulse 83    Temp 98.3 F (36.8 C)    Ht $R'5\' 4"'cf$  (1.626 m)    Wt 182 lb (82.6 kg)    LMP 07/25/2012    SpO2 99%    BMI 31.24 kg/m    Wt Readings from Last 3 Encounters:  03/26/21 182 lb (82.6 kg)  09/21/20 154 lb 3.2 oz (69.9 kg)  05/14/20 164 lb (74.4 kg)    Physical Exam Vitals and nursing note reviewed.  Constitutional:      General: She is not in acute distress.    Appearance: Normal appearance. She is well-developed and well-groomed. She is obese. She is not ill-appearing, toxic-appearing or diaphoretic.  HENT:     Head: Normocephalic and atraumatic.     Jaw: There is normal jaw occlusion.     Right Ear: Hearing normal.     Left Ear: Hearing normal.     Nose: Nose normal.     Mouth/Throat:     Lips: Pink.     Mouth: Mucous membranes are moist.     Pharynx: Oropharynx is clear. Uvula midline.  Eyes:     General: Lids are normal.     Extraocular Movements: Extraocular movements intact.     Conjunctiva/sclera: Conjunctivae normal.     Pupils: Pupils are equal, round, and reactive to light.  Neck:     Thyroid: No thyroid mass, thyromegaly or thyroid tenderness.     Vascular: No carotid bruit or JVD.     Trachea: Trachea and phonation normal.  Cardiovascular:     Rate and Rhythm: Normal rate and regular rhythm.     Chest Wall: PMI is not displaced.     Pulses: Normal pulses.     Heart sounds: Normal heart sounds. No murmur heard.   No friction rub. No gallop.  Pulmonary:     Effort: Pulmonary effort is normal. No respiratory distress.     Breath sounds: Normal breath sounds. No wheezing.   Abdominal:     General: Bowel sounds are normal. There is no distension or abdominal bruit.     Palpations: Abdomen is soft. There is no hepatomegaly or splenomegaly.     Tenderness: There is no abdominal tenderness. There is no right CVA tenderness or left CVA tenderness.     Hernia: No hernia is present.  Musculoskeletal:        General: Normal range of motion.     Cervical back: Normal range of motion and neck supple.     Right lower leg: No edema.     Left lower leg: No edema.  Lymphadenopathy:     Cervical: No cervical adenopathy.  Skin:    General: Skin is warm and dry.     Capillary Refill: Capillary refill takes less than 2 seconds.     Coloration: Skin is pale. Skin is not cyanotic or jaundiced.     Findings: No rash.  Neurological:     General: No focal deficit present.     Mental Status: She is alert and oriented to person, place, and time.     Cranial Nerves: No cranial nerve deficit.     Sensory: Sensation is intact. No sensory deficit.     Motor: Motor function is intact. No weakness.     Coordination: Coordination is intact. Coordination normal.     Gait: Gait is intact. Gait normal.     Deep Tendon Reflexes: Reflexes are normal and symmetric. Reflexes normal.  Psychiatric:        Attention and Perception: Attention and perception normal.        Mood and Affect: Mood and affect normal.        Speech: Speech normal.        Behavior: Behavior normal. Behavior is cooperative.        Thought Content: Thought content normal.        Cognition and Memory: Cognition and memory normal.        Judgment: Judgment normal.    Results for orders placed or performed in visit on 01/08/21  Veritor Flu A/B Waived  Result Value Ref Range   Influenza A Positive (A) Negative   Influenza B Negative Negative       Pertinent labs & imaging results that were available during my care of the patient were reviewed by me and considered in my medical decision making.  Assessment &  Plan:  M[place]ynn was seen today for 6 month follow up.  Diagnoses and all orders for this visit:  Iron deficiency anemia due to chronic blood loss Will recheck labs today with anemia profile. Will start iron repletion therapy if warranted. If remains low, will have to determine cause.  -     Anemia Profile B  Pure hypercholesterolemia LDL elevated in the past. Will recheck labs today.  -     Lipid panel  BMI 31.0-31.9,adult Diet and exercise encouraged. Labs pending.  -     Anemia Profile B -     CMP14+EGFR -     Thyroid Panel With TSH -     Lipid panel  Paresthesia of foot, bilateral Could be due to vitamin deficiencies or worsening anemia. Labs pending. Further treatment pending results.  -     Anemia Profile B -     CMP14+EGFR -     VITAMIN D 25 Hydroxy (Vit-D Deficiency, Fractures) -     Vitamin B12     Continue all other maintenance medications.  Follow up plan: Return in about 6 months (around 09/23/2021), or if symptoms worsen or fail to improve.   Continue healthy lifestyle choices, including diet (rich in fruits, vegetables, and lean proteins, and low in salt and simple carbohydrates) and exercise (at least 30 minutes of moderate physical activity daily).  Educational handout given for anemia, paresthesias  The above assessment and management plan was discussed with the patient. The patient verbalized understanding of and has agreed to the management plan. Patient is aware to call the clinic if they develop any new symptoms or if symptoms persist or worsen. Patient is aware when to return to the clinic  for a follow-up visit. Patient educated on when it is appropriate to go to the emergency department.   Monia Pouch, FNP-C [facility] [phone]

## 2021-03-27 LAB — CMP14+EGFR
ALT: 22 IU/L (ref 0–32)
AST: 23 IU/L (ref 0–40)
Albumin/Globulin Ratio: 1.5 (ref 1.2–2.2)
Albumin: 4.2 g/dL (ref 3.8–4.8)
Alkaline Phosphatase: 68 IU/L (ref 44–121)
BUN/Creatinine Ratio: 18 (ref 9–23)
BUN: 13 mg/dL (ref 6–24)
Bilirubin Total: 0.6 mg/dL (ref 0.0–1.2)
CO2: 23 mmol/L (ref 20–29)
Calcium: 9.2 mg/dL (ref 8.7–10.2)
Chloride: 103 mmol/L (ref 96–106)
Creatinine, Ser: 0.72 mg/dL (ref 0.57–1.00)
Globulin, Total: 2.8 g/dL (ref 1.5–4.5)
Glucose: 88 mg/dL (ref 70–99)
Potassium: 4.3 mmol/L (ref 3.5–5.2)
Sodium: 140 mmol/L (ref 134–144)
Total Protein: 7 g/dL (ref 6.0–8.5)
eGFR: 106 mL/min/{1.73_m2} (ref >59)

## 2021-03-27 LAB — ANEMIA PROFILE B
Basophils Absolute: 0 10*3/uL (ref 0.0–0.2)
Basos: 1 %
EOS (ABSOLUTE): 0.2 10*3/uL (ref 0.0–0.4)
Eos: 4 %
Ferritin: 4 ng/mL — ABNORMAL LOW (ref 15–150)
Folate: 14.9 ng/mL (ref >3.0)
Hematocrit: 33.2 % — ABNORMAL LOW (ref 34.0–46.6)
Hemoglobin: 10.4 g/dL — ABNORMAL LOW (ref 11.1–15.9)
Immature Grans (Abs): 0 10*3/uL (ref 0.0–0.1)
Immature Granulocytes: 0 %
Iron Saturation: 7 % — CL (ref 15–55)
Iron: 32 ug/dL (ref 27–159)
Lymphocytes Absolute: 1.9 10*3/uL (ref 0.7–3.1)
Lymphs: 34 %
MCH: 24 pg — ABNORMAL LOW (ref 26.6–33.0)
MCHC: 31.3 g/dL — ABNORMAL LOW (ref 31.5–35.7)
MCV: 77 fL — ABNORMAL LOW (ref 79–97)
Monocytes Absolute: 0.5 10*3/uL (ref 0.1–0.9)
Monocytes: 8 %
Neutrophils Absolute: 2.8 10*3/uL (ref 1.4–7.0)
Neutrophils: 53 %
Platelets: 332 10*3/uL (ref 150–450)
RBC: 4.34 x10E6/uL (ref 3.77–5.28)
RDW: 17.2 % — ABNORMAL HIGH (ref 11.7–15.4)
Retic Ct Pct: 1.5 % (ref 0.6–2.6)
Total Iron Binding Capacity: 463 ug/dL — ABNORMAL HIGH (ref 250–450)
UIBC: 431 ug/dL — ABNORMAL HIGH (ref 131–425)
Vitamin B-12: 239 pg/mL (ref 232–1245)
WBC: 5.4 10*3/uL (ref 3.4–10.8)

## 2021-03-27 LAB — THYROID PANEL WITH TSH
Free Thyroxine Index: 1.4 (ref 1.2–4.9)
T3 Uptake Ratio: 19 % — ABNORMAL LOW (ref 24–39)
T4, Total: 7.3 ug/dL (ref 4.5–12.0)
TSH: 1.33 u[IU]/mL (ref 0.450–4.500)

## 2021-03-27 LAB — LIPID PANEL
Chol/HDL Ratio: 4.4 ratio (ref 0.0–4.4)
Cholesterol, Total: 225 mg/dL — ABNORMAL HIGH (ref 100–199)
HDL: 51 mg/dL (ref >39)
LDL Chol Calc (NIH): 132 mg/dL — ABNORMAL HIGH (ref 0–99)
Triglycerides: 235 mg/dL — ABNORMAL HIGH (ref 0–149)
VLDL Cholesterol Cal: 42 mg/dL — ABNORMAL HIGH (ref 5–40)

## 2021-03-27 LAB — VITAMIN D 25 HYDROXY (VIT D DEFICIENCY, FRACTURES): Vit D, 25-Hydroxy: 27 ng/mL — ABNORMAL LOW (ref 30.0–100.0)

## 2021-03-27 MED ORDER — IRON (FERROUS SULFATE) 325 (65 FE) MG PO TABS
325.0000 mg | ORAL_TABLET | Freq: Every day | ORAL | 5 refills | Status: DC
Start: 2021-03-27 — End: 2022-04-14

## 2021-03-27 NOTE — Addendum Note (Signed)
Addended by: Sonny Masters on: 03/27/2021 08:00 AM   Modules accepted: Orders

## 2021-04-26 ENCOUNTER — Encounter: Payer: Self-pay | Admitting: Family Medicine

## 2021-04-26 ENCOUNTER — Ambulatory Visit (INDEPENDENT_AMBULATORY_CARE_PROVIDER_SITE_OTHER): Payer: 59 | Admitting: Family Medicine

## 2021-04-26 VITALS — BP 114/73 | HR 90 | Temp 97.5°F | Ht 64.0 in | Wt 189.0 lb

## 2021-04-26 DIAGNOSIS — D500 Iron deficiency anemia secondary to blood loss (chronic): Secondary | ICD-10-CM

## 2021-04-26 DIAGNOSIS — Z789 Other specified health status: Secondary | ICD-10-CM | POA: Diagnosis not present

## 2021-04-26 DIAGNOSIS — Z6832 Body mass index (BMI) 32.0-32.9, adult: Secondary | ICD-10-CM | POA: Diagnosis not present

## 2021-04-26 NOTE — Progress Notes (Signed)
Subjective:  Patient ID: Karen Gillespie, female    DOB: May 07, 1975, 46 y.o.   MRN: [id number]  Patient Care Team: Gwenlyn Perking, FNP as PCP - General (Family Medicine)   Chief Complaint:  Obesity   HPI: Karen Gillespie is a 46 y.o. female presenting on 04/26/2021 for Obesity   Pt presents today for weight management. At last visit, pt had mentioned restarting phentermine for weight loss as she had been on this in the past and tolerated well. She was educated on weight loss measures and told to follow up in 4 weeks for reevaluation. If her measures were successful, phentermine would be considered. Pt has gained since this visit. States she has had foot pain and has not been able to exercise but also admits to not following a good diet. At previous visit her iron was noted to be very low and she was started on iron repletion therapy. She has been taking and tolerating well. She denies constipation, nausea, vomiting, abdominal pain, abnormal bleeding or bruising, chest pain, shortness of breath, palpitations, dizziness, syncope, or pallor.    Relevant past medical, surgical, family, and social history reviewed and updated as indicated.  Allergies and medications reviewed and updated. Data reviewed: Chart in Epic.   Past Medical History:  Diagnosis Date   Nail fungus    shingles    Past Surgical History:  Procedure Laterality Date   CESAREAN SECTION     x 4   CHOLECYSTECTOMY N/A 12/21/2012   Procedure: LAPAROSCOPIC CHOLECYSTECTOMY WITH INTRAOPERATIVE CHOLANGIOGRAM;  Surgeon: Jamesetta So, MD;  Location: [facility];  Service: General;  Laterality: N/A;   Dilation and cutterage     LEAP      TUBAL LIGATION      Social History   Socioeconomic History   Marital status: Married    Spouse name: Not on file   Number of children: Not on file   Years of education: Not on file   Highest education level: Not on file  Occupational History   Not on file  Tobacco Use    Smoking status: Former    Packs/day: 0.50    Years: 15.00    Pack years: 7.50    Types: Cigarettes    Quit date: 03/10/2020    Years since quitting: 1.1   Smokeless tobacco: Never  Vaping Use   Vaping Use: Never used  Substance and Sexual Activity   Alcohol use: No   Drug use: No   Sexual activity: Yes    Birth control/protection: Surgical  Other Topics Concern   Not on file  Social History Narrative   Not on file   Social Determinants of Health   Financial Resource Strain: Not on file  Food Insecurity: Not on file  Transportation Needs: Not on file  Physical Activity: Not on file  Stress: Not on file  Social Connections: Not on file  Intimate Partner Violence: Not on file    Outpatient Encounter Medications as of 04/26/2021  Medication Sig   Iron, Ferrous Sulfate, 325 (65 Fe) MG TABS Take 325 mg by mouth daily.   No facility-administered encounter medications on file as of 04/26/2021.    Allergies  Allergen Reactions   Penicillins Shortness Of Breath, Swelling and Rash   Lamisil [Terbinafine] Nausea Only   Codeine Swelling and Rash   Latex Swelling and Rash    Review of Systems  Constitutional:  Negative for activity change, appetite change, chills, diaphoresis, fatigue, fever and  unexpected weight change.  HENT: Negative.    Eyes: Negative.  Negative for photophobia and visual disturbance.  Respiratory:  Negative for cough, chest tightness and shortness of breath.   Cardiovascular:  Negative for chest pain, palpitations and leg swelling.  Gastrointestinal:  Negative for abdominal distention, abdominal pain, anal bleeding, blood in stool, constipation, diarrhea, nausea, rectal pain and vomiting.  Endocrine: Negative.   Genitourinary:  Negative for dysuria, frequency, hematuria, menstrual problem, urgency and vaginal bleeding.  Musculoskeletal:  Positive for myalgias (feet). Negative for arthralgias.  Skin: Negative.   Allergic/Immunologic: Negative.    Neurological:  Negative for dizziness, tremors, seizures, syncope, facial asymmetry, speech difficulty, weakness, light-headedness, numbness and headaches.  Hematological: Negative.  Does not bruise/bleed easily.  Psychiatric/Behavioral:  Negative for confusion, hallucinations, sleep disturbance and suicidal ideas.   All other systems reviewed and are negative.      Objective:  BP 114/73    Pulse 90    Temp (!) 97.5 F (36.4 C) (Temporal)    Ht '5\' 4"'  (1.626 m)    Wt 189 lb (85.7 kg)    LMP 07/25/2012    BMI 32.44 kg/m    Wt Readings from Last 3 Encounters:  04/26/21 189 lb (85.7 kg)  03/26/21 182 lb (82.6 kg)  09/21/20 154 lb 3.2 oz (69.9 kg)    Physical Exam Vitals and nursing note reviewed.  Constitutional:      General: She is not in acute distress.    Appearance: Normal appearance. She is obese. She is not ill-appearing, toxic-appearing or diaphoretic.  HENT:     Head: Normocephalic and atraumatic.  Eyes:     Conjunctiva/sclera: Conjunctivae normal.     Pupils: Pupils are equal, round, and reactive to light.  Cardiovascular:     Rate and Rhythm: Normal rate and regular rhythm.     Heart sounds: Normal heart sounds.  Pulmonary:     Effort: Pulmonary effort is normal.     Breath sounds: Normal breath sounds.  Skin:    General: Skin is warm and dry.     Capillary Refill: Capillary refill takes less than 2 seconds.     Coloration: Skin is not pale.  Neurological:     General: No focal deficit present.     Mental Status: She is alert and oriented to person, place, and time.  Psychiatric:        Mood and Affect: Mood normal.        Behavior: Behavior normal.        Thought Content: Thought content normal.        Judgment: Judgment normal.    Results for orders placed or performed in visit on 03/26/21  Anemia Profile B  Result Value Ref Range   Total Iron Binding Capacity 463 (H) 250 - 450 ug/dL   UIBC 431 (H) 131 - 425 ug/dL   Iron 32 27 - 159 ug/dL   Iron  Saturation 7 (LL) 15 - 55 %   Ferritin 4 (L) 15 - 150 ng/mL   Vitamin B-12 239 232 - 1,245 pg/mL   Folate 14.9 >3.0 ng/mL   WBC 5.4 3.4 - 10.8 x10E3/uL   RBC 4.34 3.77 - 5.28 x10E6/uL   Hemoglobin 10.4 (L) 11.1 - 15.9 g/dL   Hematocrit 33.2 (L) 34.0 - 46.6 %   MCV 77 (L) 79 - 97 fL   MCH 24.0 (L) 26.6 - 33.0 pg   MCHC 31.3 (L) 31.5 - 35.7 g/dL   RDW 17.2 (H)  11.7 - 15.4 %   Platelets 332 150 - 450 x10E3/uL   Neutrophils 53 Not Estab. %   Lymphs 34 Not Estab. %   Monocytes 8 Not Estab. %   Eos 4 Not Estab. %   Basos 1 Not Estab. %   Neutrophils Absolute 2.8 1.4 - 7.0 x10E3/uL   Lymphocytes Absolute 1.9 0.7 - 3.1 x10E3/uL   Monocytes Absolute 0.5 0.1 - 0.9 x10E3/uL   EOS (ABSOLUTE) 0.2 0.0 - 0.4 x10E3/uL   Basophils Absolute 0.0 0.0 - 0.2 x10E3/uL   Immature Granulocytes 0 Not Estab. %   Immature Grans (Abs) 0.0 0.0 - 0.1 x10E3/uL   Retic Ct Pct 1.5 0.6 - 2.6 %  CMP14+EGFR  Result Value Ref Range   Glucose 88 70 - 99 mg/dL   BUN 13 6 - 24 mg/dL   Creatinine, Ser 0.72 0.57 - 1.00 mg/dL   eGFR 106 >59 mL/min/1.73   BUN/Creatinine Ratio 18 9 - 23   Sodium 140 134 - 144 mmol/L   Potassium 4.3 3.5 - 5.2 mmol/L   Chloride 103 96 - 106 mmol/L   CO2 23 20 - 29 mmol/L   Calcium 9.2 8.7 - 10.2 mg/dL   Total Protein 7.0 6.0 - 8.5 g/dL   Albumin 4.2 3.8 - 4.8 g/dL   Globulin, Total 2.8 1.5 - 4.5 g/dL   Albumin/Globulin Ratio 1.5 1.2 - 2.2   Bilirubin Total 0.6 0.0 - 1.2 mg/dL   Alkaline Phosphatase 68 44 - 121 IU/L   AST 23 0 - 40 IU/L   ALT 22 0 - 32 IU/L  Thyroid Panel With TSH  Result Value Ref Range   TSH 1.330 0.450 - 4.500 uIU/mL   T4, Total 7.3 4.5 - 12.0 ug/dL   T3 Uptake Ratio 19 (L) 24 - 39 %   Free Thyroxine Index 1.4 1.2 - 4.9  Lipid panel  Result Value Ref Range   Cholesterol, Total 225 (H) 100 - 199 mg/dL   Triglycerides 235 (H) 0 - 149 mg/dL   HDL 51 >39 mg/dL   VLDL Cholesterol Cal 42 (H) 5 - 40 mg/dL   LDL Chol Calc (NIH) 132 (H) 0 - 99 mg/dL   Chol/HDL  Ratio 4.4 0.0 - 4.4 ratio  VITAMIN D 25 Hydroxy (Vit-D Deficiency, Fractures)  Result Value Ref Range   Vit D, 25-Hydroxy 27.0 (L) 30.0 - 100.0 ng/mL       Pertinent labs & imaging results that were available during my care of the patient were reviewed by me and considered in my medical decision making.  Assessment & Plan:  Adilyn was seen today for obesity.  Diagnoses and all orders for this visit:  Iron deficiency anemia due to chronic blood loss Has been taking iron repletion therapy as prescribed. Will recheck labs today.  -     Iron, TIBC and Ferritin Panel -     CBC with Differential/Platelet  BMI 32.0-32.9,adult Educated about management of weight Pt requesting to restart phentermine for weight loss. She has gained weight since last visit. Pt educated on weight loss success and the need for diet and exercise to help drop weight and keep it off. Provided information on calorie counting for weight loss and my fitness pal app information to help hold her accountable for her efforts. Pt aware if she is able to maintain or drop weight by next visit, medications to assist in efforts will be considered. Pt aware she must put forth the effort.  Continue all other maintenance medications.  Follow up plan: Return in about 4 weeks (around 05/24/2021), or if symptoms worsen or fail to improve, for weight management.   Continue healthy lifestyle choices, including diet (rich in fruits, vegetables, and lean proteins, and low in salt and simple carbohydrates) and exercise (at least 30 minutes of moderate physical activity daily).  Educational handout given for calorie counting for weight loss  The above assessment and management plan was discussed with the patient. The patient verbalized understanding of and has agreed to the management plan. Patient is aware to call the clinic if they develop any new symptoms or if symptoms persist or worsen. Patient is aware when to return to the  clinic for a follow-up visit. Patient educated on when it is appropriate to go to the emergency department.   Monia Pouch, FNP-C [facility] [phone]

## 2021-04-26 NOTE — Patient Instructions (Addendum)
Diet and exercise for next 4 weeks. Keep detailed food and exercise diary. Follow up in 4 weeks for BMI recheck.  My Fitness Pal App.

## 2021-04-27 LAB — IRON,TIBC AND FERRITIN PANEL
Ferritin: 22 ng/mL (ref 15–150)
Iron Saturation: 6 % — CL (ref 15–55)
Iron: 25 ug/dL — ABNORMAL LOW (ref 27–159)
Total Iron Binding Capacity: 392 ug/dL (ref 250–450)
UIBC: 367 ug/dL (ref 131–425)

## 2021-04-27 LAB — CBC WITH DIFFERENTIAL/PLATELET
Basophils Absolute: 0 10*3/uL (ref 0.0–0.2)
Basos: 1 %
EOS (ABSOLUTE): 0.2 10*3/uL (ref 0.0–0.4)
Eos: 4 %
Hematocrit: 33.8 % — ABNORMAL LOW (ref 34.0–46.6)
Hemoglobin: 10.7 g/dL — ABNORMAL LOW (ref 11.1–15.9)
Immature Grans (Abs): 0 10*3/uL (ref 0.0–0.1)
Immature Granulocytes: 0 %
Lymphocytes Absolute: 2 10*3/uL (ref 0.7–3.1)
Lymphs: 37 %
MCH: 25.5 pg — ABNORMAL LOW (ref 26.6–33.0)
MCHC: 31.7 g/dL (ref 31.5–35.7)
MCV: 81 fL (ref 79–97)
Monocytes Absolute: 0.5 10*3/uL (ref 0.1–0.9)
Monocytes: 8 %
Neutrophils Absolute: 2.8 10*3/uL (ref 1.4–7.0)
Neutrophils: 50 %
Platelets: 271 10*3/uL (ref 150–450)
RBC: 4.19 x10E6/uL (ref 3.77–5.28)
RDW: 18.9 % — ABNORMAL HIGH (ref 11.7–15.4)
WBC: 5.5 10*3/uL (ref 3.4–10.8)

## 2021-05-24 ENCOUNTER — Encounter: Payer: Self-pay | Admitting: Family Medicine

## 2021-05-24 ENCOUNTER — Ambulatory Visit (INDEPENDENT_AMBULATORY_CARE_PROVIDER_SITE_OTHER): Payer: 59 | Admitting: Family Medicine

## 2021-05-24 VITALS — BP 120/69 | HR 74 | Temp 97.9°F | Ht 64.0 in | Wt 189.4 lb

## 2021-05-24 DIAGNOSIS — Z789 Other specified health status: Secondary | ICD-10-CM | POA: Diagnosis not present

## 2021-05-24 DIAGNOSIS — D500 Iron deficiency anemia secondary to blood loss (chronic): Secondary | ICD-10-CM

## 2021-05-24 DIAGNOSIS — Z6832 Body mass index (BMI) 32.0-32.9, adult: Secondary | ICD-10-CM

## 2021-05-24 MED ORDER — SEMAGLUTIDE-WEIGHT MANAGEMENT 0.25 MG/0.5ML ~~LOC~~ SOAJ: 0.2500 mg | SUBCUTANEOUS | 0 refills | Status: DC

## 2021-05-24 MED ORDER — SEMAGLUTIDE-WEIGHT MANAGEMENT 1.7 MG/0.75ML ~~LOC~~ SOAJ: 1.7000 mg | SUBCUTANEOUS | 0 refills | Status: DC

## 2021-05-24 MED ORDER — SEMAGLUTIDE-WEIGHT MANAGEMENT 2.4 MG/0.75ML ~~LOC~~ SOAJ: 2.4000 mg | SUBCUTANEOUS | 0 refills | Status: DC

## 2021-05-24 MED ORDER — SEMAGLUTIDE-WEIGHT MANAGEMENT 0.5 MG/0.5ML ~~LOC~~ SOAJ: 0.5000 mg | SUBCUTANEOUS | 0 refills | Status: DC

## 2021-05-24 MED ORDER — SEMAGLUTIDE-WEIGHT MANAGEMENT 1 MG/0.5ML ~~LOC~~ SOAJ: 1.0000 mg | SUBCUTANEOUS | 0 refills | Status: DC

## 2021-05-24 NOTE — Progress Notes (Signed)
?  ? ?Subjective:  ?Patient ID: Karen Gillespie, female    DOB: July 05, 1975, 46 y.o.   MRN: [id number] ? ?Patient Care Team: ?Gwenlyn Perking, FNP as PCP - General (Family Medicine)  ? ?Chief Complaint:  Obesity ? ? ?HPI: ?Karen Gillespie is a 46 y.o. female presenting on 05/24/2021 for Obesity ? ? ?Pt presents today for BMI follow up / weight management. At last visit she was educated on measures to help with weight loss and proper use of phentermine for weight loss. Pt was made aware she had to make lifestyle changes to help with weight management. She was tasked to maintain or drop weight by this visit to be able to start weight loss medications. She has gained almost a pound since last visit. She did not bring a food or exercise log today. She states she has a very hectic life and does not have time for herself.  ?She is also on iron repletion therapy for IDA. She has been taking as prescribed. Does have hard stools at times. No nausea, vomiting, abdominal pain, or abnormal bleeding or bruising.  ? ? ? ?Relevant past medical, surgical, family, and social history reviewed and updated as indicated.  ?Allergies and medications reviewed and updated. Data reviewed: Chart in Epic. ? ? ?Past Medical History:  ?Diagnosis Date  ? Nail fungus   ? shingles  ? ? ?Past Surgical History:  ?Procedure Laterality Date  ? CESAREAN SECTION    ? x 4  ? CHOLECYSTECTOMY N/A 12/21/2012  ? Procedure: LAPAROSCOPIC CHOLECYSTECTOMY WITH INTRAOPERATIVE CHOLANGIOGRAM;  Surgeon: Jamesetta So, MD;  Location: [facility];  Service: General;  Laterality: N/A;  ? Dilation and cutterage    ? LEAP     ? TUBAL LIGATION    ? ? ?Social History  ? ?Socioeconomic History  ? Marital status: Married  ?  Spouse name: Not on file  ? Number of children: Not on file  ? Years of education: Not on file  ? Highest education level: Not on file  ?Occupational History  ? Not on file  ?Tobacco Use  ? Smoking status: Former  ?  Packs/day: 0.50  ?  Years: 15.00  ?   Pack years: 7.50  ?  Types: Cigarettes  ?  Quit date: 03/10/2020  ?  Years since quitting: 1.2  ? Smokeless tobacco: Never  ?Vaping Use  ? Vaping Use: Never used  ?Substance and Sexual Activity  ? Alcohol use: No  ? Drug use: No  ? Sexual activity: Yes  ?  Birth control/protection: Surgical  ?Other Topics Concern  ? Not on file  ?Social History Narrative  ? Not on file  ? ?Social Determinants of Health  ? ?Financial Resource Strain: Not on file  ?Food Insecurity: Not on file  ?Transportation Needs: Not on file  ?Physical Activity: Not on file  ?Stress: Not on file  ?Social Connections: Not on file  ?Intimate Partner Violence: Not on file  ? ? ?Outpatient Encounter Medications as of 05/24/2021  ?Medication Sig  ? Iron, Ferrous Sulfate, 325 (65 Fe) MG TABS Take 325 mg by mouth daily.  ? Semaglutide-Weight Management 0.25 MG/0.5ML SOAJ Inject 0.25 mg into the skin once a week for 28 days.  ? [START ON 06/22/2021] Semaglutide-Weight Management 0.5 MG/0.5ML SOAJ Inject 0.5 mg into the skin once a week for 28 days.  ? [START ON 07/21/2021] Semaglutide-Weight Management 1 MG/0.5ML SOAJ Inject 1 mg into the skin once a week for 28 days.  ? [  START ON 08/19/2021] Semaglutide-Weight Management 1.7 MG/0.75ML SOAJ Inject 1.7 mg into the skin once a week for 28 days.  ? [START ON 09/17/2021] Semaglutide-Weight Management 2.4 MG/0.75ML SOAJ Inject 2.4 mg into the skin once a week for 28 days.  ? ?No facility-administered encounter medications on file as of 05/24/2021.  ? ? ?Allergies  ?Allergen Reactions  ? Penicillins Shortness Of Breath, Swelling and Rash  ? Lamisil [Terbinafine] Nausea Only  ? Codeine Swelling and Rash  ? Latex Swelling and Rash  ? ? ?Review of Systems  ?Gastrointestinal:  Positive for constipation.  ?All other systems reviewed and are negative. ? ?   ? ?Objective:  ?BP 120/69   Pulse 74   Temp 97.9 ?F (36.6 ?C) (Temporal)   Ht 5\' 4"  (1.626 m)   Wt 189 lb 6 oz (85.9 kg)   LMP 07/25/2012   BMI 32.51 kg/m?   ? ?Wt  Readings from Last 3 Encounters:  ?05/24/21 189 lb 6 oz (85.9 kg)  ?04/26/21 189 lb (85.7 kg)  ?03/26/21 182 lb (82.6 kg)  ? ? ?Physical Exam ?Vitals and nursing note reviewed.  ?Constitutional:   ?   Appearance: Normal appearance. She is obese.  ?HENT:  ?   Head: Normocephalic and atraumatic.  ?Eyes:  ?   Pupils: Pupils are equal, round, and reactive to light.  ?Cardiovascular:  ?   Rate and Rhythm: Normal rate.  ?Pulmonary:  ?   Effort: Pulmonary effort is normal. No respiratory distress.  ?Musculoskeletal:  ?   Cervical back: Normal range of motion.  ?   Right lower leg: No edema.  ?   Left lower leg: No edema.  ?Skin: ?   General: Skin is warm and dry.  ?   Capillary Refill: Capillary refill takes less than 2 seconds.  ?Neurological:  ?   General: No focal deficit present.  ?   Mental Status: She is alert and oriented to person, place, and time.  ?Psychiatric:     ?   Mood and Affect: Mood normal.     ?   Behavior: Behavior normal.     ?   Thought Content: Thought content normal.     ?   Judgment: Judgment normal.  ? ? ?Results for orders placed or performed in visit on 04/26/21  ?Iron, TIBC and Ferritin Panel  ?Result Value Ref Range  ? Total Iron Binding Capacity 392 250 - 450 ug/dL  ? UIBC 367 131 - 425 ug/dL  ? Iron 25 (L) 27 - 159 ug/dL  ? Iron Saturation 6 (LL) 15 - 55 %  ? Ferritin 22 15 - 150 ng/mL  ?CBC with Differential/Platelet  ?Result Value Ref Range  ? WBC 5.5 3.4 - 10.8 x10E3/uL  ? RBC 4.19 3.77 - 5.28 x10E6/uL  ? Hemoglobin 10.7 (L) 11.1 - 15.9 g/dL  ? Hematocrit 33.8 (L) 34.0 - 46.6 %  ? MCV 81 79 - 97 fL  ? MCH 25.5 (L) 26.6 - 33.0 pg  ? MCHC 31.7 31.5 - 35.7 g/dL  ? RDW 18.9 (H) 11.7 - 15.4 %  ? Platelets 271 150 - 450 x10E3/uL  ? Neutrophils 50 Not Estab. %  ? Lymphs 37 Not Estab. %  ? Monocytes 8 Not Estab. %  ? Eos 4 Not Estab. %  ? Basos 1 Not Estab. %  ? Neutrophils Absolute 2.8 1.4 - 7.0 x10E3/uL  ? Lymphocytes Absolute 2.0 0.7 - 3.1 x10E3/uL  ? Monocytes Absolute 0.5 0.1 - 0.9 x10E3/uL   ?  EOS (ABSOLUTE) 0.2 0.0 - 0.4 x10E3/uL  ? Basophils Absolute 0.0 0.0 - 0.2 x10E3/uL  ? Immature Granulocytes 0 Not Estab. %  ? Immature Grans (Abs) 0.0 0.0 - 0.1 x10E3/uL  ? ?   ? ?Pertinent labs & imaging results that were available during my care of the patient were reviewed by me and considered in my medical decision making. ? ?Assessment & Plan:  ?Sheilah was seen today for obesity. ? ?Diagnoses and all orders for this visit: ? ?Educated about management of weight ?BMI 32.0-32.9,adult ?Long discussion today about weight loss and patients participation in weight loss measures for weight loss to be successful long term. Will start PA process for wegovy, this will likely not be approved and pt aware. Pt to follow up in 2 weeks and aware she has to maintain or drop weight to discuss further weight loss measures. Pt aware lifestyle changes are 80% of the weight loss equation. Pt aware she has to put in the effort for this to be successful.  ?-     Semaglutide-Weight Management 0.25 MG/0.5ML SOAJ; Inject 0.25 mg into the skin once a week for 28 days. ?-     Semaglutide-Weight Management 0.5 MG/0.5ML SOAJ; Inject 0.5 mg into the skin once a week for 28 days. ?-     Semaglutide-Weight Management 1 MG/0.5ML SOAJ; Inject 1 mg into the skin once a week for 28 days. ?-     Semaglutide-Weight Management 1.7 MG/0.75ML SOAJ; Inject 1.7 mg into the skin once a week for 28 days. ?-     Semaglutide-Weight Management 2.4 MG/0.75ML SOAJ; Inject 2.4 mg into the skin once a week for 28 days. ? ?Iron deficiency anemia due to chronic blood loss ?Taking iron repletion as prescribed. Will recheck labs today. ?-     Anemia Profile B ? ?  ? ?Continue all other maintenance medications. ? ?Follow up plan: ?Return in about 2 weeks (around 06/07/2021), or if symptoms worsen or fail to improve. ? ? ?Continue healthy lifestyle choices, including diet (rich in fruits, vegetables, and lean proteins, and low in salt and simple carbohydrates)  and exercise (at least 30 minutes of moderate physical activity daily). ? ?Educational handout given for calorie counting for weight loss ? ?The above assessment and management plan was discussed with the pa

## 2021-05-25 LAB — ANEMIA PROFILE B
Basophils Absolute: 0 10*3/uL (ref 0.0–0.2)
Basos: 1 %
EOS (ABSOLUTE): 0.3 10*3/uL (ref 0.0–0.4)
Eos: 4 %
Ferritin: 30 ng/mL (ref 15–150)
Folate: 18.1 ng/mL (ref >3.0)
Hematocrit: 38.8 % (ref 34.0–46.6)
Hemoglobin: 12.7 g/dL (ref 11.1–15.9)
Immature Grans (Abs): 0 10*3/uL (ref 0.0–0.1)
Immature Granulocytes: 0 %
Iron Saturation: 26 % (ref 15–55)
Iron: 97 ug/dL (ref 27–159)
Lymphocytes Absolute: 2 10*3/uL (ref 0.7–3.1)
Lymphs: 31 %
MCH: 27.3 pg (ref 26.6–33.0)
MCHC: 32.7 g/dL (ref 31.5–35.7)
MCV: 83 fL (ref 79–97)
Monocytes Absolute: 0.5 10*3/uL (ref 0.1–0.9)
Monocytes: 8 %
Neutrophils Absolute: 3.6 10*3/uL (ref 1.4–7.0)
Neutrophils: 56 %
Platelets: 255 10*3/uL (ref 150–450)
RBC: 4.65 x10E6/uL (ref 3.77–5.28)
RDW: 18 % — ABNORMAL HIGH (ref 11.7–15.4)
Retic Ct Pct: 1.5 % (ref 0.6–2.6)
Total Iron Binding Capacity: 380 ug/dL (ref 250–450)
UIBC: 283 ug/dL (ref 131–425)
Vitamin B-12: 396 pg/mL (ref 232–1245)
WBC: 6.4 10*3/uL (ref 3.4–10.8)

## 2021-06-03 ENCOUNTER — Telehealth: Payer: Self-pay | Admitting: *Deleted

## 2021-06-03 NOTE — Telephone Encounter (Signed)
PA for Wegovy-In Process ? ? ?KeyDelilah Shan ? ?[id number] ? ? ?[facility] has not yet replied to your PA request. You may close this dialog, return to your dashboard, and perform other tasks. ? ?To check for an update later, open this request again from your dashboard. ? ?If [facility] has not replied within 72 hours for urgent requests and up to 15 days for standard requests, please contact [facility] at [phone]. ?

## 2021-06-10 ENCOUNTER — Encounter: Payer: Self-pay | Admitting: Family Medicine

## 2021-06-10 ENCOUNTER — Ambulatory Visit (INDEPENDENT_AMBULATORY_CARE_PROVIDER_SITE_OTHER): Payer: 59 | Admitting: Family Medicine

## 2021-06-10 VITALS — BP 127/62 | HR 68 | Temp 98.2°F | Ht 64.0 in | Wt 197.1 lb

## 2021-06-10 DIAGNOSIS — E669 Obesity, unspecified: Secondary | ICD-10-CM | POA: Diagnosis not present

## 2021-06-10 DIAGNOSIS — F419 Anxiety disorder, unspecified: Secondary | ICD-10-CM

## 2021-06-10 DIAGNOSIS — F339 Major depressive disorder, recurrent, unspecified: Secondary | ICD-10-CM | POA: Diagnosis not present

## 2021-06-10 MED ORDER — PHENTERMINE HCL 37.5 MG PO CAPS: 37.5000 mg | ORAL_CAPSULE | ORAL | 0 refills | Status: DC

## 2021-06-10 MED ORDER — BUPROPION HCL ER (XL) 150 MG PO TB24: 150.0000 mg | ORAL_TABLET | Freq: Every day | ORAL | 1 refills | Status: DC

## 2021-06-10 NOTE — Patient Instructions (Signed)
Calorie Counting for Weight Loss ?Calories are units of energy. Your body needs a certain number of calories from food to keep going throughout the day. When you eat or drink more calories than your body needs, your body stores the extra calories mostly as fat. When you eat or drink fewer calories than your body needs, your body burns fat to get the energy it needs. ?Calorie counting means keeping track of how many calories you eat and drink each day. Calorie counting can be helpful if you need to lose weight. If you eat fewer calories than your body needs, you should lose weight. Ask your health care provider what a healthy weight is for you. ?For calorie counting to work, you will need to eat the right number of calories each day to lose a healthy amount of weight per week. A dietitian can help you figure out how many calories you need in a day and will suggest ways to reach your calorie goal. ?A healthy amount of weight to lose each week is usually 1-2 lb (0.5-0.9 kg). This usually means that your daily calorie intake should be reduced by 500-750 calories. ?Eating 1,200-1,500 calories a day can help most women lose weight. ?Eating 1,500-1,800 calories a day can help most men lose weight. ?What do I need to know about calorie counting? ?Work with your health care provider or dietitian to determine how many calories you should get each day. To meet your daily calorie goal, you will need to: ?Find out how many calories are in each food that you would like to eat. Try to do this before you eat. ?Decide how much of the food you plan to eat. ?Keep a food log. Do this by writing down what you ate and how many calories it had. ?To successfully lose weight, it is important to balance calorie counting with a healthy lifestyle that includes regular activity. ?Where do I find calorie information? ?The number of calories in a food can be found on a Nutrition Facts label. If a food does not have a Nutrition Facts label, try to  look up the calories online or ask your dietitian for help. ?Remember that calories are listed per serving. If you choose to have more than one serving of a food, you will have to multiply the calories per serving by the number of servings you plan to eat. For example, the label on a package of bread might say that a serving size is 1 slice and that there are 90 calories in a serving. If you eat 1 slice, you will have eaten 90 calories. If you eat 2 slices, you will have eaten 180 calories. ?How do I keep a food log? ?After each time that you eat, record the following in your food log as soon as possible: ?What you ate. Be sure to include toppings, sauces, and other extras on the food. ?How much you ate. This can be measured in cups, ounces, or number of items. ?How many calories were in each food and drink. ?The total number of calories in the food you ate. ?Keep your food log near you, such as in a pocket-sized notebook or on an app or website on your mobile phone. Some programs will calculate calories for you and show you how many calories you have left to meet your daily goal. ?What are some portion-control tips? ?Know how many calories are in a serving. This will help you know how many servings you can have of a certain food. ?  Use a measuring cup to measure serving sizes. You could also try weighing out portions on a kitchen scale. With time, you will be able to estimate serving sizes for some foods. ?Take time to put servings of different foods on your favorite plates or in your favorite bowls and cups so you know what a serving looks like. ?Try not to eat straight from a food's packaging, such as from a bag or box. Eating straight from the package makes it hard to see how much you are eating and can lead to overeating. Put the amount you would like to eat in a cup or on a plate to make sure you are eating the right portion. ?Use smaller plates, glasses, and bowls for smaller portions and to prevent  overeating. ?Try not to multitask. For example, avoid watching TV or using your computer while eating. If it is time to eat, sit down at a table and enjoy your food. This will help you recognize when you are full. It will also help you be more mindful of what and how much you are eating. ?What are tips for following this plan? ?Reading food labels ?Check the calorie count compared with the serving size. The serving size may be smaller than what you are used to eating. ?Check the source of the calories. Try to choose foods that are high in protein, fiber, and vitamins, and low in saturated fat, trans fat, and sodium. ?Shopping ?Read nutrition labels while you shop. This will help you make healthy decisions about which foods to buy. ?Pay attention to nutrition labels for low-fat or fat-free foods. These foods sometimes have the same number of calories or more calories than the full-fat versions. They also often have added sugar, starch, or salt to make up for flavor that was removed with the fat. ?Make a grocery list of lower-calorie foods and stick to it. ?Cooking ?Try to cook your favorite foods in a healthier way. For example, try baking instead of frying. ?Use low-fat dairy products. ?Meal planning ?Use more fruits and vegetables. One-half of your plate should be fruits and vegetables. ?Include lean proteins, such as chicken, turkey, and fish. ?Lifestyle ?Each week, aim to do one of the following: ?150 minutes of moderate exercise, such as walking. ?75 minutes of vigorous exercise, such as running. ?General information ?Know how many calories are in the foods you eat most often. This will help you calculate calorie counts faster. ?Find a way of tracking calories that works for you. Get creative. Try different apps or programs if writing down calories does not work for you. ?What foods should I eat? ? ?Eat nutritious foods. It is better to have a nutritious, high-calorie food, such as an avocado, than a food with  few nutrients, such as a bag of potato chips. ?Use your calories on foods and drinks that will fill you up and will not leave you hungry soon after eating. ?Examples of foods that fill you up are nuts and nut butters, vegetables, lean proteins, and high-fiber foods such as whole grains. High-fiber foods are foods with more than 5 g of fiber per serving. ?Pay attention to calories in drinks. Low-calorie drinks include water and unsweetened drinks. ?The items listed above may not be a complete list of foods and beverages you can eat. Contact a dietitian for more information. ?What foods should I limit? ?Limit foods or drinks that are not good sources of vitamins, minerals, or protein or that are high in unhealthy fats. These include: ?  Candy. ?Other sweets. ?Sodas, specialty coffee drinks, alcohol, and juice. ?The items listed above may not be a complete list of foods and beverages you should avoid. Contact a dietitian for more information. ?How do I count calories when eating out? ?Pay attention to portions. Often, portions are much larger when eating out. Try these tips to keep portions smaller: ?Consider sharing a meal instead of getting your own. ?If you get your own meal, eat only half of it. Before you start eating, ask for a container and put half of your meal into it. ?When available, consider ordering smaller portions from the menu instead of full portions. ?Pay attention to your food and drink choices. Knowing the way food is cooked and what is included with the meal can help you eat fewer calories. ?If calories are listed on the menu, choose the lower-calorie options. ?Choose dishes that include vegetables, fruits, whole grains, low-fat dairy products, and lean proteins. ?Choose items that are boiled, broiled, grilled, or steamed. Avoid items that are buttered, battered, fried, or served with cream sauce. Items labeled as crispy are usually fried, unless stated otherwise. ?Choose water, low-fat milk,  unsweetened iced tea, or other drinks without added sugar. If you want an alcoholic beverage, choose a lower-calorie option, such as a glass of wine or light beer. ?Ask for dressings, sauces, and syrups on the side. T

## 2021-06-10 NOTE — Telephone Encounter (Signed)
[place] Denied ? ?PA Case: [id number], Status: Closed, Closed Reason Code: FM Product not covered by this plan. Prior Authorization not available., Closed Rationale: [employer] [facility] Prior Authorization team is unable to review this product for a coverage determination as the requested medication is not on the patient's formulary. Please reach out to [facility] directly for this request. Thank you in advance.. Questions? Contact [phone]. ? ? ?Pt has appt later today will advise at appt. ?

## 2021-06-10 NOTE — Progress Notes (Signed)
? ?Established Patient Office Visit ? ?Subjective:  ?Patient ID: Karen Gillespie, female    DOB: [birth year]  Age: 46 y.o. MRN: [id number] ? ?CC:  ?Chief Complaint  ?Patient presents with  ? Obesity  ? ? ?HPI ?Karen Gillespie presents for chronic follow up.  ? ?She reports increased anxiety and depression. She feels overwhelmed taking care of her family. She feels moody and irritable often. She also feels down often. She reports that there is a seasonal aspect to her depression and feels like things improve when the weather improves. She would like to restart on Wellbutrin as she has down well on this in the past.  ? ?She also would like to restart on phentermine. She has gained weight since her last visit. She reports that she has been trying to log her calories and has been trying to be more active. She previously did well on phentermine without side effects. Her insurance would not approve wegovy.  ? ? ?  06/10/2021  ? 10:09 AM 04/26/2021  ? 10:10 AM 03/26/2021  ? 10:54 AM  ?Depression screen PHQ 2/9  ?Decreased Interest 1 0 1  ?Down, Depressed, Hopeless _0 ?PHQ - 2 Score _1 ?Altered sleeping 1  3  ?Tired, decreased energy 1  0  ?Change in appetite 1  1  ?Feeling bad or failure about yourself  1  0  ?Trouble concentrating 0  0  ?Moving slowly or fidgety/restless 0  0  ?Suicidal thoughts 0  0  ?PHQ-9 Score 6  6  ?Difficult doing work/chores Not difficult at all  Not difficult at all  ? ? ?  06/10/2021  ? 10:10 AM 03/26/2021  ? 10:53 AM 09/21/2020  ? 10:37 AM 02/15/2020  ? 11:07 AM  ?GAD 7 : Generalized Anxiety Score  ?Nervous, Anxious, on Edge _2 ?Control/stop worrying _3 ?Worry too much - different things _4 ?Trouble relaxing 1 1 0 3  ?Restless 0 0 0 3  ?Easily annoyed or irritable 1 1 0 1  ?Afraid - awful might happen 0 0 0 0  ?Total GAD 7 Score _5 ?Anxiety Difficulty Somewhat difficult Not difficult at all Not difficult at all Somewhat difficult  ? ? ? ?Past Medical History:   ?Diagnosis Date  ? Nail fungus   ? shingles  ? ? ?Past Surgical History:  ?Procedure Laterality Date  ? CESAREAN SECTION    ? x 4  ? CHOLECYSTECTOMY N/A 12/21/2012  ? Procedure: LAPAROSCOPIC CHOLECYSTECTOMY WITH INTRAOPERATIVE CHOLANGIOGRAM;  Surgeon: Jamesetta So, MD;  Location: [facility];  Service: General;  Laterality: N/A;  ? Dilation and cutterage    ? LEAP     ? TUBAL LIGATION    ? ? ?Family History  ?Problem Relation Age of Onset  ? Diabetes Mother   ? Hypertension Mother   ? Heart disease Maternal Grandmother   ? Heart failure Maternal Grandmother   ? Cancer Maternal Grandfather   ?     brain  ? Cancer Paternal Grandmother   ?     lung  ? ? ?Social History  ? ?Socioeconomic History  ? Marital status: Married  ?  Spouse name: Not on file  ? Number of children: Not on file  ? Years of education: Not on file  ? Highest education level: Not on file  ?Occupational History  ? Not on file  ?Tobacco  Use  ? Smoking status: Former  ?  Packs/day: 0.50  ?  Years: 15.00  ?  Pack years: 7.50  ?  Types: Cigarettes  ?  Quit date: 03/10/2020  ?  Years since quitting: 1.2  ? Smokeless tobacco: Never  ?Vaping Use  ? Vaping Use: Never used  ?Substance and Sexual Activity  ? Alcohol use: No  ? Drug use: No  ? Sexual activity: Yes  ?  Birth control/protection: Surgical  ?Other Topics Concern  ? Not on file  ?Social History Narrative  ? Not on file  ? ?Social Determinants of Health  ? ?Financial Resource Strain: Not on file  ?Food Insecurity: Not on file  ?Transportation Needs: Not on file  ?Physical Activity: Not on file  ?Stress: Not on file  ?Social Connections: Not on file  ?Intimate Partner Violence: Not on file  ? ? ?Outpatient Medications Prior to Visit  ?Medication Sig Dispense Refill  ? Iron, Ferrous Sulfate, 325 (65 Fe) MG TABS Take 325 mg by mouth daily. 30 tablet 5  ? Multiple Vitamin (MULTIVITAMIN) tablet Take 1 tablet by mouth daily.    ? Semaglutide-Weight Management 0.25 MG/0.5ML SOAJ Inject 0.25 mg into the skin  once a week for 28 days. 2 mL 0  ? [START ON 06/22/2021] Semaglutide-Weight Management 0.5 MG/0.5ML SOAJ Inject 0.5 mg into the skin once a week for 28 days. 2 mL 0  ? [START ON 07/21/2021] Semaglutide-Weight Management 1 MG/0.5ML SOAJ Inject 1 mg into the skin once a week for 28 days. 2 mL 0  ? [START ON 08/19/2021] Semaglutide-Weight Management 1.7 MG/0.75ML SOAJ Inject 1.7 mg into the skin once a week for 28 days. 3 mL 0  ? [START ON 09/17/2021] Semaglutide-Weight Management 2.4 MG/0.75ML SOAJ Inject 2.4 mg into the skin once a week for 28 days. 3 mL 0  ? ?No facility-administered medications prior to visit.  ? ? ?Allergies  ?Allergen Reactions  ? Penicillins Shortness Of Breath, Swelling and Rash  ? Lamisil [Terbinafine] Nausea Only  ? Codeine Swelling and Rash  ? Latex Swelling and Rash  ? ? ?ROS ?Review of Systems ?As per HPI.  ?  ?Objective:  ?  ?Physical Exam ?Vitals and nursing note reviewed.  ?Constitutional:   ?   General: She is not in acute distress. ?   Appearance: She is not ill-appearing, toxic-appearing or diaphoretic.  ?HENT:  ?   Head: Normocephalic and atraumatic.  ?   Nose: Nose normal.  ?   Mouth/Throat:  ?   Mouth: Mucous membranes are moist.  ?   Pharynx: Oropharynx is clear.  ?Eyes:  ?   Extraocular Movements: Extraocular movements intact.  ?   Pupils: Pupils are equal, round, and reactive to light.  ?Cardiovascular:  ?   Rate and Rhythm: Normal rate and regular rhythm.  ?   Heart sounds: Normal heart sounds. No murmur heard. ?Pulmonary:  ?   Effort: Pulmonary effort is normal. No respiratory distress.  ?   Breath sounds: Normal breath sounds.  ?Musculoskeletal:  ?   Right lower leg: No edema.  ?   Left lower leg: No edema.  ?Skin: ?   General: Skin is warm and dry.  ?Neurological:  ?   General: No focal deficit present.  ?   Mental Status: She is alert and oriented to person, place, and time.  ?   Gait: Gait normal.  ?Psychiatric:     ?   Mood and Affect: Mood normal.     ?  Behavior: Behavior  normal.     ?   Thought Content: Thought content normal.     ?   Judgment: Judgment normal.  ? ? ?BP 127/62   Pulse 68   Temp 98.2 ?F (36.8 ?C) (Temporal)   Ht _0  (1.626 m)   Wt 197 lb 2 oz (89.4 kg)   LMP 07/25/2012   BMI 33.84 kg/m?  ?Wt Readings from Last 3 Encounters:  ?06/10/21 197 lb 2 oz (89.4 kg)  ?05/24/21 189 lb 6 oz (85.9 kg)  ?04/26/21 189 lb (85.7 kg)  ? ? ? ?Health Maintenance Due  ?Topic Date Due  ? COVID-19 Vaccine (1) Never done  ? MAMMOGRAM  Never done  ? ? ?There are no preventive care reminders to display for this patient. ? ?Lab Results  ?Component Value Date  ? TSH 1.330 03/26/2021  ? ?Lab Results  ?Component Value Date  ? WBC 6.4 05/24/2021  ? HGB 12.7 05/24/2021  ? HCT 38.8 05/24/2021  ? MCV 83 05/24/2021  ? PLT 255 05/24/2021  ? ?Lab Results  ?Component Value Date  ? NA 140 03/26/2021  ? K 4.3 03/26/2021  ? CO2 23 03/26/2021  ? GLUCOSE 88 03/26/2021  ? BUN 13 03/26/2021  ? CREATININE 0.72 03/26/2021  ? BILITOT 0.6 03/26/2021  ? ALKPHOS 68 03/26/2021  ? AST 23 03/26/2021  ? ALT 22 03/26/2021  ? PROT 7.0 03/26/2021  ? ALBUMIN 4.2 03/26/2021  ? CALCIUM 9.2 03/26/2021  ? EGFR 106 03/26/2021  ? ?Lab Results  ?Component Value Date  ? CHOL 225 (H) 03/26/2021  ? ?Lab Results  ?Component Value Date  ? HDL 51 03/26/2021  ? ?Lab Results  ?Component Value Date  ? LDLCALC 132 (H) 03/26/2021  ? ?Lab Results  ?Component Value Date  ? TRIG 235 (H) 03/26/2021  ? ?Lab Results  ?Component Value Date  ? CHOLHDL 4.4 03/26/2021  ? ?No results found for: HGBA1C ? ?  ?Assessment & Plan:  ? ?Javanna was seen today for obesity. ? ?Diagnoses and all orders for this visit: ? ?Obesity (BMI 30-39.9) ?PDMP reviewed, no red flags. Previously did very well on phentermine. Will restart. Discussed diet, exercise, and lifestyle modifications.  ?-     phentermine 37.5 MG capsule; Take 1 capsule (37.5 mg total) by mouth every morning. ? ?Depression, recurrent ([place]) ?Anxiety ?Not well controlled. Restart wellbutrin.  Denies SI.  ?-     buPROPion (WELLBUTRIN XL) 150 MG 24 hr tablet; Take 1 tablet (150 mg total) by mouth daily. ? ?Follow-up: Return in about 4 weeks (around 07/08/2021) for medication follow up.  ? ?The patient indicate

## 2021-07-10 ENCOUNTER — Ambulatory Visit: Payer: 59 | Admitting: Family Medicine

## 2021-07-11 ENCOUNTER — Encounter: Payer: Self-pay | Admitting: Family Medicine

## 2021-07-11 ENCOUNTER — Other Ambulatory Visit: Payer: Self-pay

## 2021-07-11 ENCOUNTER — Telehealth: Payer: Self-pay | Admitting: Family Medicine

## 2021-07-11 ENCOUNTER — Ambulatory Visit (INDEPENDENT_AMBULATORY_CARE_PROVIDER_SITE_OTHER): Payer: 59 | Admitting: Family Medicine

## 2021-07-11 DIAGNOSIS — E669 Obesity, unspecified: Secondary | ICD-10-CM | POA: Diagnosis not present

## 2021-07-11 DIAGNOSIS — F419 Anxiety disorder, unspecified: Secondary | ICD-10-CM | POA: Diagnosis not present

## 2021-07-11 DIAGNOSIS — F339 Major depressive disorder, recurrent, unspecified: Secondary | ICD-10-CM | POA: Diagnosis not present

## 2021-07-11 DIAGNOSIS — Z1231 Encounter for screening mammogram for malignant neoplasm of breast: Secondary | ICD-10-CM

## 2021-07-11 MED ORDER — BUPROPION HCL ER (XL) 150 MG PO TB24: 150.0000 mg | ORAL_TABLET | Freq: Every day | ORAL | 1 refills | Status: DC

## 2021-07-11 MED ORDER — PHENTERMINE HCL 37.5 MG PO CAPS: 37.5000 mg | ORAL_CAPSULE | ORAL | 0 refills | Status: DC

## 2021-07-11 NOTE — Patient Instructions (Signed)

## 2021-07-11 NOTE — Telephone Encounter (Signed)
Order placed for Karen Gillespie - patient notified and advised to call for appt if she does not hear from their department  ?

## 2021-07-11 NOTE — Progress Notes (Signed)
? ?Established Patient Office Visit ? ?Subjective   ?Patient ID: Karen Gillespie, female    DOB: 05-12-1975  Age: 46 y.o. MRN: [id number] ? ?Chief Complaint  ?Patient presents with  ? Medication Refill  ?  Wellbutrin , phentermine  ? ? ?HPI ? ?Weight loss ?She has been on phentermine for 1 month. She has noticed a dry mouth. Has been walking on the wii, jogging, walking over 10000 steps a day.  Has been managing 30 mins of exercise a day. She has been working on a better balanced diet. She has been eating low fat, low carb diet and eating more fruits.  ? ?2. Anxiety and depression ?Feels like Wellbutrin is working well.  ? ? ?  07/11/2021  ?  9:03 AM 06/10/2021  ? 10:09 AM 04/26/2021  ? 10:10 AM  ?Depression screen PHQ 2/9  ?Decreased Interest 0 1 0  ?Down, Depressed, Hopeless '1 1 1  ' ?PHQ - 2 Score '1 2 1  ' ?Altered sleeping 1 1   ?Tired, decreased energy  1   ?Change in appetite 1 1   ?Feeling bad or failure about yourself  0 1   ?Trouble concentrating 0 0   ?Moving slowly or fidgety/restless 0 0   ?Suicidal thoughts 0 0   ?PHQ-9 Score 3 6   ?Difficult doing work/chores Not difficult at all Not difficult at all   ? ? ?  07/11/2021  ?  9:04 AM 06/10/2021  ? 10:10 AM 03/26/2021  ? 10:53 AM 09/21/2020  ? 10:37 AM  ?GAD 7 : Generalized Anxiety Score  ?Nervous, Anxious, on Edge 0 '2 1 1  ' ?Control/stop worrying 0 '2 1 1  ' ?Worry too much - different things '1 1 1 1  ' ?Trouble relaxing 0 1 1 0  ?Restless 0 0 0 0  ?Easily annoyed or irritable 0 1 1 0  ?Afraid - awful might happen 1 0 0 0  ?Total GAD 7 Score '2 7 5 3  ' ?Anxiety Difficulty Not difficult at all Somewhat difficult Not difficult at all Not difficult at all  ? ? ? ? ?ROS ? ?  ?Objective:  ?  ? ?BP 120/77 (BP Location: Right Arm, Patient Position: Sitting, Cuff Size: Normal)   Pulse 95   Temp 98.6 ?F (37 ?C) (Temporal)   Resp 20   Ht '5\' 4"'  (1.626 m)   Wt 182 lb (82.6 kg)   LMP 06/10/2012 (Approximate)   SpO2 98%   BMI 31.24 kg/m?  ?Wt Readings from Last 3 Encounters:   ?07/11/21 182 lb (82.6 kg)  ?06/10/21 197 lb 2 oz (89.4 kg)  ?05/24/21 189 lb 6 oz (85.9 kg)  ? ?  ? ?Physical Exam ?Vitals and nursing note reviewed.  ?Constitutional:   ?   General: She is not in acute distress. ?   Appearance: She is not ill-appearing, toxic-appearing or diaphoretic.  ?HENT:  ?   Nose: Nose normal.  ?   Mouth/Throat:  ?   Mouth: Mucous membranes are moist.  ?   Pharynx: Oropharynx is clear.  ?Eyes:  ?   Extraocular Movements: Extraocular movements intact.  ?   Pupils: Pupils are equal, round, and reactive to light.  ?Cardiovascular:  ?   Rate and Rhythm: Normal rate and regular rhythm.  ?   Heart sounds: Normal heart sounds. No murmur heard. ?Pulmonary:  ?   Effort: Pulmonary effort is normal. No respiratory distress.  ?   Breath sounds: Normal breath sounds.  ?Abdominal:  ?  General: Bowel sounds are normal. There is no distension.  ?   Palpations: Abdomen is soft.  ?   Tenderness: There is no abdominal tenderness.  ?Musculoskeletal:  ?   Right lower leg: No edema.  ?   Left lower leg: No edema.  ?Skin: ?   General: Skin is warm and dry.  ?Neurological:  ?   General: No focal deficit present.  ?   Mental Status: She is alert and oriented to person, place, and time.  ?Psychiatric:     ?   Mood and Affect: Mood normal.     ?   Behavior: Behavior normal.     ?   Thought Content: Thought content normal.     ?   Judgment: Judgment normal.  ? ? ? ?No results found for any visits on 07/11/21. ? ?Last CBC ?Lab Results  ?Component Value Date  ? WBC 6.4 05/24/2021  ? HGB 12.7 05/24/2021  ? HCT 38.8 05/24/2021  ? MCV 83 05/24/2021  ? MCH 27.3 05/24/2021  ? RDW 18.0 (H) 05/24/2021  ? PLT 255 05/24/2021  ? ?Last metabolic panel ?Lab Results  ?Component Value Date  ? GLUCOSE 88 03/26/2021  ? NA 140 03/26/2021  ? K 4.3 03/26/2021  ? CL 103 03/26/2021  ? CO2 23 03/26/2021  ? BUN 13 03/26/2021  ? CREATININE 0.72 03/26/2021  ? EGFR 106 03/26/2021  ? CALCIUM 9.2 03/26/2021  ? PHOS 3.0 12/20/2012  ? PROT 7.0  03/26/2021  ? ALBUMIN 4.2 03/26/2021  ? LABGLOB 2.8 03/26/2021  ? AGRATIO 1.5 03/26/2021  ? BILITOT 0.6 03/26/2021  ? ALKPHOS 68 03/26/2021  ? AST 23 03/26/2021  ? ALT 22 03/26/2021  ? ?Last hemoglobin A1c ?No results found for: HGBA1C ?  ? ?The 10-year ASCVD risk score (Arnett DK, et al., 2019) is: 0.9% ? ?  ?Assessment & Plan:  ? ?[facility] was seen today for medication refill. ? ?Diagnoses and all orders for this visit: ? ?Depression, recurrent ([place]) ?Anxiety ?Well controlled on current regimen.  ?-     buPROPion (WELLBUTRIN XL) 150 MG 24 hr tablet; Take 1 tablet (150 mg total) by mouth daily. ? ?Obesity (BMI 30-39.9) ?Down 15 lbs. Continue diet, exercise, and phentermine. PDMP reviewed, no red flags, refill provided. Discussed hydration, sugar free gum or candy for dry mouth.  ?-     phentermine 37.5 MG capsule; Take 1 capsule (37.5 mg total) by mouth every morning. ? ?Return in about 4 weeks (around 08/08/2021) for weight, schedule mammo appt on bus.  ? ?The patient indicates understanding of these issues and agrees with the plan. ? ? ?Gwenlyn Perking, FNP ? ?

## 2021-08-08 ENCOUNTER — Encounter: Payer: Self-pay | Admitting: Family Medicine

## 2021-08-08 ENCOUNTER — Ambulatory Visit (INDEPENDENT_AMBULATORY_CARE_PROVIDER_SITE_OTHER): Payer: 59 | Admitting: Family Medicine

## 2021-08-08 VITALS — BP 117/71 | HR 98 | Temp 98.0°F | Ht 64.0 in | Wt 176.2 lb

## 2021-08-08 DIAGNOSIS — E669 Obesity, unspecified: Secondary | ICD-10-CM | POA: Diagnosis not present

## 2021-08-08 DIAGNOSIS — F419 Anxiety disorder, unspecified: Secondary | ICD-10-CM

## 2021-08-08 DIAGNOSIS — F339 Major depressive disorder, recurrent, unspecified: Secondary | ICD-10-CM

## 2021-08-08 MED ORDER — PHENTERMINE HCL 37.5 MG PO CAPS: 37.5000 mg | ORAL_CAPSULE | ORAL | 0 refills | Status: DC

## 2021-08-08 NOTE — Progress Notes (Signed)
Established Patient Office Visit  Subjective   Patient ID: Karen Gillespie, female    DOB: 06/30/1975  Age: 46 y.o. MRN: [id number]  Chief Complaint  Patient presents with   Obesity    HPI Here for weight check. She reports doing well on phentemrine without side effect. This has been her second month on this. She has been exercising daily with step aerobics, crunches, and boxing. She has been eating more fruits, smaller portions, low fat diary and leans meats. She has lost 5 lbs since her last visit.      08/08/2021   10:09 AM 07/11/2021    9:03 AM 06/10/2021   10:09 AM  Depression screen PHQ 2/9  Decreased Interest 0 0 1  Down, Depressed, Hopeless 0 1 1  PHQ - 2 Score 0 1 2  Altered sleeping 1 1 1   Tired, decreased energy 0  1  Change in appetite 0 1 1  Feeling bad or failure about yourself  1 0 1  Trouble concentrating 0 0 0  Moving slowly or fidgety/restless 0 0 0  Suicidal thoughts 0 0 0  PHQ-9 Score 2 3 6   Difficult doing work/chores Not difficult at all Not difficult at all Not difficult at all      08/08/2021   10:10 AM 07/11/2021    9:04 AM 06/10/2021   10:10 AM 03/26/2021   10:53 AM  GAD 7 : Generalized Anxiety Score  Nervous, Anxious, on Edge 0 0 2 1  Control/stop worrying 1 0 2 1  Worry too much - different things 1 1 1 1   Trouble relaxing 0 0 1 1  Restless 1 0 0 0  Easily annoyed or irritable 0 0 1 1  Afraid - awful might happen 0 1 0 0  Total GAD 7 Score 3 2 7 5   Anxiety Difficulty Not difficult at all Not difficult at all Somewhat difficult Not difficult at all     Past Medical History:  Diagnosis Date   Nail fungus    shingles      ROS As per HPI.    Objective:     BP 117/71   Pulse 98   Temp 98 F (36.7 C) (Temporal)   Ht 5\' 4"  (1.626 m)   Wt 176 lb 4 oz (79.9 kg)   SpO2 99%   BMI 30.25 kg/m  Wt Readings from Last 3 Encounters:  08/08/21 176 lb 4 oz (79.9 kg)  07/11/21 182 lb (82.6 kg)  06/10/21 197 lb 2 oz (89.4 kg)       Physical Exam Vitals and nursing note reviewed.  Constitutional:      General: She is not in acute distress.    Appearance: She is not ill-appearing, toxic-appearing or diaphoretic.  Cardiovascular:     Rate and Rhythm: Normal rate and regular rhythm.     Heart sounds: Normal heart sounds. No murmur heard. Pulmonary:     Effort: Pulmonary effort is normal. No respiratory distress.     Breath sounds: Normal breath sounds.  Abdominal:     General: Bowel sounds are normal. There is no distension.     Palpations: Abdomen is soft.     Tenderness: There is no abdominal tenderness. There is no guarding or rebound.  Skin:    General: Skin is warm and dry.  Neurological:     General: No focal deficit present.     Mental Status: She is alert and oriented to person, place, and time.  Psychiatric:        Mood and Affect: Mood normal.        Behavior: Behavior normal.     No results found for any visits on 08/08/21.    The 10-year ASCVD risk score (Arnett DK, et al., 2019) is: 0.9%    Assessment & Plan:   Ellysia was seen today for obesity.  Diagnoses and all orders for this visit:  Obesity (BMI 30-39.9) PDMP reviewed, no red flags. Doing well so far. Refill provided. Continue diet and exercise with phentermine.  -     phentermine 37.5 MG capsule; Take 1 capsule (37.5 mg total) by mouth every morning.  Depression, recurrent ([place]) Anxiety Well controlled with wellbutrin.   Return in about 4 weeks (around 09/05/2021) for weight check.   The patient indicates understanding of these issues and agrees with the plan.  Gwenlyn Perking, FNP

## 2021-08-08 NOTE — Patient Instructions (Signed)

## 2021-09-05 ENCOUNTER — Encounter: Payer: Self-pay | Admitting: Family Medicine

## 2021-09-05 ENCOUNTER — Telehealth: Payer: Self-pay | Admitting: Family Medicine

## 2021-09-05 ENCOUNTER — Ambulatory Visit (INDEPENDENT_AMBULATORY_CARE_PROVIDER_SITE_OTHER): Payer: 59 | Admitting: Family Medicine

## 2021-09-05 VITALS — BP 117/71 | HR 90 | Temp 97.8°F | Ht 64.0 in | Wt 169.4 lb

## 2021-09-05 DIAGNOSIS — E663 Overweight: Secondary | ICD-10-CM | POA: Diagnosis not present

## 2021-09-05 MED ORDER — PHENTERMINE HCL 37.5 MG PO CAPS: 37.5000 mg | ORAL_CAPSULE | ORAL | 0 refills | Status: DC

## 2021-09-05 NOTE — Telephone Encounter (Signed)
Appointment made with MMM

## 2021-09-05 NOTE — Progress Notes (Signed)
Established Patient Office Visit  Subjective   Patient ID: Karen Gillespie, female    DOB: [birth year]  Age: 46 y.o. MRN: [id number]  Chief Complaint  Patient presents with   Obesity    Patient presents to clinic today for follow up with phentermine, has lost 7 pounds since last visit. She has been maintaining a well balanced diet. She has been exercising for with the wii fit 6 days a week. She denies side effects.     Past Medical History:  Diagnosis Date   Nail fungus    shingles      Review of Systems  Constitutional:  Positive for weight loss (trying). Negative for chills and fever.  HENT:  Negative for ear pain, hearing loss and sore throat.   Eyes:  Negative for blurred vision and double vision.  Respiratory:  Negative for cough, sputum production, shortness of breath and stridor.   Cardiovascular:  Negative for chest pain and palpitations.  Gastrointestinal:  Negative for abdominal pain, heartburn, nausea and vomiting.  Genitourinary:  Negative for dysuria, frequency and urgency.  Musculoskeletal:  Negative for back pain, joint pain and neck pain.  Skin:  Negative for itching and rash.  Neurological:  Negative for dizziness, tingling, tremors, sensory change and headaches.  Endo/Heme/Allergies:  Negative for polydipsia. Does not bruise/bleed easily.  Psychiatric/Behavioral:  Negative for depression, hallucinations, substance abuse and suicidal ideas. The patient is not nervous/anxious and does not have insomnia.       Objective:     BP 117/71   Pulse 90   Temp 97.8 F (36.6 C) (Temporal)   Ht 5\' 4"  (1.626 m)   Wt 76.8 kg   SpO2 99%   BMI 29.07 kg/m  BP Readings from Last 3 Encounters:  09/05/21 117/71  08/08/21 117/71  07/11/21 120/77      Physical Exam Constitutional:      Appearance: Normal appearance.  HENT:     Head: Normocephalic and atraumatic.     Right Ear: Tympanic membrane, ear canal and external ear normal.     Left Ear: Tympanic  membrane, ear canal and external ear normal.     Nose: Nose normal.     Mouth/Throat:     Mouth: Mucous membranes are moist.  Eyes:     Pupils: Pupils are equal, round, and reactive to light.  Cardiovascular:     Rate and Rhythm: Normal rate and regular rhythm.     Pulses: Normal pulses.     Heart sounds: Normal heart sounds.  Pulmonary:     Effort: Pulmonary effort is normal.     Breath sounds: Normal breath sounds.  Abdominal:     Palpations: Abdomen is soft.  Musculoskeletal:        General: Normal range of motion.     Cervical back: Normal range of motion and neck supple.  Skin:    General: Skin is warm and dry.     Capillary Refill: Capillary refill takes less than 2 seconds.  Neurological:     Mental Status: She is alert and oriented to person, place, and time.  Psychiatric:        Mood and Affect: Mood normal.        Behavior: Behavior normal.        Thought Content: Thought content normal.        Judgment: Judgment normal.    No results found for any visits on 09/05/21.    The 10-year ASCVD risk score (Arnett DK, et  al., 2019) is: 0.9%    Assessment & Plan:   Christasia was seen today for obesity.  Diagnoses and all orders for this visit:  Overweight (BMI 25.0-29.9) PDMP reviewed, no red flags. Has lost an additional 7 lbs in the last month. Doing well with diet and exercise. Refilled for another month.  -     phentermine 37.5 MG capsule; Take 1 capsule (37.5 mg total) by mouth every morning.  Follow up in 1 month.  Kary Kos, RN, FNP student  I personally was present during the history, physical exam, and medical decision-making activities of this service and have verified that the service and findings are accurately documented in the nurse practitioner student's note.  Harlow Mares, FNP

## 2021-09-23 ENCOUNTER — Ambulatory Visit (INDEPENDENT_AMBULATORY_CARE_PROVIDER_SITE_OTHER): Payer: 59 | Admitting: Nurse Practitioner

## 2021-09-23 ENCOUNTER — Encounter: Payer: Self-pay | Admitting: Nurse Practitioner

## 2021-09-23 VITALS — BP 124/80 | HR 97 | Temp 98.0°F | Resp 20 | Ht 64.0 in | Wt 169.0 lb

## 2021-09-23 DIAGNOSIS — Z3009 Encounter for other general counseling and advice on contraception: Secondary | ICD-10-CM

## 2021-09-23 NOTE — Patient Instructions (Signed)
Levonorgestrel Intrauterine Device ?What is this medication? ?LEVONORGESTREL (LEE voe nor jes trel) prevents ovulation and pregnancy. It may also be used to treat heavy periods. It belongs to a group of medications called contraceptives. This medication is a progestin hormone. ?This medicine may be used for other purposes; ask your health care provider or pharmacist if you have questions. ?COMMON BRAND NAME(S): Kyleena, LILETTA, Mirena, Skyla ?What should I tell my care team before I take this medication? ?They need to know if you have any of these conditions: ?Abnormal Pap smear ?Cancer of the breast, uterus, or cervix ?Diabetes ?Endometritis ?Genital or pelvic infection now or in the past ?Have more than one sexual partner or your partner has more than one partner ?Heart disease ?History of an ectopic or tubal pregnancy ?Immune system problems ?IUD in place ?Liver disease or tumor ?Problems with blood clots or take blood-thinners ?Seizures ?Use intravenous drugs ?Uterus of unusual shape ?Vaginal bleeding that has not been explained ?An unusual or allergic reaction to levonorgestrel, other hormones, silicone, or polyethylene, medications, foods, dyes, or preservatives ?Pregnant or trying to get pregnant ?Breast-feeding ?How should I use this medication? ?This device is placed inside the uterus by your care team. ?A patient package insert for the product will be given each time it is inserted. Be sure to read this information carefully each time. The sheet may change often. ?Talk to your care team about use of this medication in children. Special care may be needed. ?Overdosage: If you think you have taken too much of this medicine contact a poison control center or emergency room at once. ?NOTE: This medicine is only for you. Do not share this medicine with others. ?What if I miss a dose? ?This does not apply. Depending on the brand of device you have inserted, the device will need to be replaced every 3 to 8 years  if you wish to continue using this type of birth control. ?What may interact with this medication? ?Interactions are not expected. Tell your care team about all the medications you take. ?This list may not describe all possible interactions. Give your health care provider a list of all the medicines, herbs, non-prescription drugs, or dietary supplements you use. Also tell them if you smoke, drink alcohol, or use illegal drugs. Some items may interact with your medicine. ?What should I watch for while using this medication? ?Visit your care team for regular check-ups. Tell your care team if you or your partner becomes HIV positive or gets a sexually transmitted disease. ?Using this medication does not protect you or your partner against HIV or other sexually transmitted infections (STIs). ?You can check the placement of the IUD yourself by reaching up to the top of your vagina with clean fingers to feel the threads. Do not pull on the threads. It is a good habit to check placement after each menstrual period. Call your care team right away if you feel more of the IUD than just the threads or if you cannot feel the threads at all. ?The IUD may come out by itself. You may become pregnant if the device comes out. If you notice that the IUD has come out use a backup birth control method like condoms and call your care team. ?Using tampons will not change the position of the IUD and are okay to use during your period. ?This IUD can be safely scanned with magnetic resonance imaging (MRI) only under specific conditions. Before you have an MRI, tell your care team that   you have an IUD in place, and which type of IUD you have in place. ?What side effects may I notice from receiving this medication? ?Side effects that you should report to your care team as soon as possible: ?Allergic reactions--skin rash, itching, hives, swelling of the face, lips, tongue, or throat ?Blood clot--pain, swelling, or warmth in the leg, shortness  of breath, chest pain ?Gallbladder problems--severe stomach pain, nausea, vomiting, fever ?Increase in blood pressure ?Liver injury--right upper belly pain, loss of appetite, nausea, light-colored stool, dark yellow or brown urine, yellowing skin or eyes, unusual weakness or fatigue ?New or worsening migraines or headaches ?Pelvic inflammatory disease (PID)--fever, abdominal pain, pelvic pain, pain or trouble passing urine, spotting, bleeding during or after sex ?Stroke--sudden numbness or weakness of the face, arm, or leg, trouble speaking, confusion, trouble walking, loss of balance or coordination, dizziness, severe headache, change in vision ?Unusual vaginal discharge, itching, or odor ?Vaginal pain, irritation, or sores ?Worsening mood, feelings of depression ?Side effects that usually do not require medical attention (report to your care team if they continue or are bothersome): ?Breast pain or tenderness ?Dark patches of skin on the face or other sun-exposed areas ?Irregular menstrual cycles or spotting ?Nausea ?Weight gain ?This list may not describe all possible side effects. Call your doctor for medical advice about side effects. You may report side effects to FDA at 1-800-FDA-1088. ?Where should I keep my medication? ?This does not apply. ?NOTE: This sheet is a summary. It may not cover all possible information. If you have questions about this medicine, talk to your doctor, pharmacist, or health care provider. ?? 2023 Elsevier/Gold Standard (2020-11-07 00:00:00) ? ?

## 2021-09-23 NOTE — Progress Notes (Signed)
   Subjective:    Patient ID: Karen Gillespie, female    DOB: May 24, 1975, 46 y.o.   MRN: [id number]   Chief Complaint: Discuss IUD   HPI Patient in to discuss getting an IUD. Her last period started this morning. She has heavy menses for the first couple of days then will spot. She is never regular tough. SHe has had 4 c sections and never fully dilated with any of her pregnancy.    Review of Systems  Constitutional:  Negative for diaphoresis.  Eyes:  Negative for pain.  Respiratory:  Negative for shortness of breath.   Cardiovascular:  Negative for chest pain, palpitations and leg swelling.  Gastrointestinal:  Negative for abdominal pain.  Endocrine: Negative for polydipsia.  Skin:  Negative for rash.  Neurological:  Negative for dizziness, weakness and headaches.  Hematological:  Does not bruise/bleed easily.  All other systems reviewed and are negative.      Objective:   Physical Exam Vitals and nursing note reviewed.  Constitutional:      General: She is not in acute distress.    Appearance: Normal appearance. She is well-developed.  Neck:     Vascular: No carotid bruit or JVD.  Cardiovascular:     Rate and Rhythm: Normal rate and regular rhythm.     Heart sounds: Normal heart sounds.  Pulmonary:     Effort: Pulmonary effort is normal. No respiratory distress.     Breath sounds: Normal breath sounds. No wheezing or rales.  Chest:     Chest wall: No tenderness.  Abdominal:     General: Bowel sounds are normal. There is no distension or abdominal bruit.     Palpations: Abdomen is soft. There is no hepatomegaly, splenomegaly, mass or pulsatile mass.     Tenderness: There is no abdominal tenderness.  Musculoskeletal:        General: Normal range of motion.     Cervical back: Normal range of motion and neck supple.  Lymphadenopathy:     Cervical: No cervical adenopathy.  Skin:    General: Skin is warm and dry.  Neurological:     Mental Status: She is alert and  oriented to person, place, and time.     Deep Tendon Reflexes: Reflexes are normal and symmetric.  Psychiatric:        Behavior: Behavior normal.        Thought Content: Thought content normal.        Judgment: Judgment normal.    BP 124/80   Pulse 97   Temp 98 F (36.7 C) (Temporal)   Resp 20   Ht 5\' 4"  (1.626 m)   Wt 169 lb (76.7 kg)   SpO2 100%   BMI 29.01 kg/m         Assessment & Plan:   in today with chief complaint of Discuss IUD   1. Counseling for birth control regarding intrauterine device (IUD) Mirena insertion on Thursady Motrin 800mg  20 min prior to procedure ANy questions- direct to DR. Dettinger.    The above assessment and management plan was discussed with the patient. The patient verbalized understanding of and has agreed to the management plan. Patient is aware to call the clinic if symptoms persist or worsen. Patient is aware when to return to the clinic for a follow-up visit. Patient educated on when it is appropriate to go to the emergency department.   Mary-Margaret 09-20-1983, FNP

## 2021-09-26 ENCOUNTER — Encounter: Payer: Self-pay | Admitting: Family Medicine

## 2021-09-26 ENCOUNTER — Ambulatory Visit (INDEPENDENT_AMBULATORY_CARE_PROVIDER_SITE_OTHER): Payer: 59 | Admitting: Family Medicine

## 2021-09-26 VITALS — BP 100/69 | HR 98 | Temp 97.5°F | Ht 64.0 in | Wt 169.0 lb

## 2021-09-26 DIAGNOSIS — Z30430 Encounter for insertion of intrauterine contraceptive device: Secondary | ICD-10-CM | POA: Diagnosis not present

## 2021-09-26 LAB — PREGNANCY, URINE: Preg Test, Ur: NEGATIVE

## 2021-09-26 MED ORDER — LEVONORGESTREL 20 MCG/DAY IU IUD
1.0000 | INTRAUTERINE_SYSTEM | Freq: Once | INTRAUTERINE | Status: AC
  Administered 2021-09-26: 1 via INTRAUTERINE

## 2021-09-26 NOTE — Progress Notes (Signed)
BP 100/69   Pulse 98   Temp (!) 97.5 F (36.4 C)   Ht 5\' 4"  (1.626 m)   Wt 169 lb (76.7 kg)   LMP 09/23/2021   SpO2 100%   BMI 29.01 kg/m    Subjective:   Patient ID: 09/25/2021, female    DOB: 1975/10/24, 46 y.o.   MRN: [id number]  HPI: Karen Gillespie is a 46 y.o. female presenting on 09/26/2021 for Mirena insertion   HPI Patient is coming in for Mirena.  She has had 4 C-sections because lack of cervical dilation.  She is coming in for birth control and would like the IUD Mirena.  She is in a steady marriage relationship and just wants birth control  Relevant past medical, surgical, family and social history reviewed and updated as indicated. Interim medical history since our last visit reviewed. Allergies and medications reviewed and updated.  Review of Systems  Genitourinary:  Negative for menstrual problem and pelvic pain.    Per HPI unless specifically indicated above   Allergies as of 09/26/2021       Reactions   Penicillins Shortness Of Breath, Swelling, Rash   Lamisil [terbinafine] Nausea Only   Codeine Swelling, Rash   Latex Swelling, Rash        Medication List        Accurate as of September 26, 2021 12:33 PM. If you have any questions, ask your nurse or doctor.          buPROPion 150 MG 24 hr tablet Commonly known as: Wellbutrin XL Take 1 tablet (150 mg total) by mouth daily.   Iron (Ferrous Sulfate) 325 (65 Fe) MG Tabs Take 325 mg by mouth daily.   multivitamin tablet Take 1 tablet by mouth daily.   phentermine 37.5 MG capsule Take 1 capsule (37.5 mg total) by mouth every morning.         Objective:   BP 100/69   Pulse 98   Temp (!) 97.5 F (36.4 C)   Ht 5\' 4"  (1.626 m)   Wt 169 lb (76.7 kg)   LMP 09/23/2021   SpO2 100%   BMI 29.01 kg/m   Wt Readings from Last 3 Encounters:  09/26/21 169 lb (76.7 kg)  09/23/21 169 lb (76.7 kg)  09/05/21 169 lb 6 oz (76.8 kg)    Physical Exam Vitals and nursing note reviewed.  Exam conducted with a chaperone present.  Genitourinary:    General: Normal vulva.     Exam position: Lithotomy position.     Vagina: Bleeding present. No vaginal discharge or tenderness.     Cervix: Cervical bleeding present. No discharge, friability or erythema.     Uterus: Normal. Not deviated, not enlarged and not tender.      Adnexa: Right adnexa normal and left adnexa normal.     IUD insertion procedure: Patient was placed in stirrups and use a speculum. Patient was prepped with Betadine swabs using cotton balls. Tenaculum was used to grab the anterior cervix. Uterine sound was performed and found to be 8cm in length and anteverted. Cervical dilation was not necessary. Mirena was placed using factory device at the correct depth that was measured and deployed without issue. The strings were cut leaving 1.5 cm extra. Patient tolerated procedure well and bleeding was minimal.   Assessment & Plan:   Problem List Items Addressed This Visit   None Visit Diagnoses     Encounter for insertion of intrauterine contraceptive device (IUD)    -  Primary   Relevant Medications   levonorgestrel (MIRENA) 20 MCG/DAY IUD 1 each (Start on 09/26/2021 12:45 PM)   Other Relevant Orders   Pregnancy, urine        Follow up plan: Return if symptoms worsen or fail to improve, for 1 to 87-month recheck IUD.  Counseling provided for all of the vaccine components Orders Placed This Encounter  Procedures   Pregnancy, urine    Arville Care, MD [facility] 09/26/2021, 12:33 PM

## 2021-10-03 ENCOUNTER — Ambulatory Visit (INDEPENDENT_AMBULATORY_CARE_PROVIDER_SITE_OTHER): Payer: 59 | Admitting: Family Medicine

## 2021-10-03 ENCOUNTER — Encounter: Payer: Self-pay | Admitting: Family Medicine

## 2021-10-03 DIAGNOSIS — E663 Overweight: Secondary | ICD-10-CM

## 2021-10-03 MED ORDER — PHENTERMINE HCL 37.5 MG PO CAPS: 37.5000 mg | ORAL_CAPSULE | ORAL | 0 refills | Status: DC

## 2021-10-03 NOTE — Progress Notes (Signed)
Established Patient Office Visit  Subjective   Patient ID: Karen Gillespie, female    DOB: May 21, 1975  Age: 46 y.o. MRN: [id number]  Chief Complaint  Patient presents with   Obesity    Patient presents to clinic today for follow up with phentermine, has lost 4 pounds since last visit. She has been maintaining a well balanced diet. She has been exercising for with the wii fit for an hour a day 6 days a week. She denies side effects.     Past Medical History:  Diagnosis Date   Nail fungus    shingles      Review of Systems  Constitutional:  Positive for weight loss (trying). Negative for chills and fever.  HENT:  Negative for ear pain, hearing loss and sore throat.   Eyes:  Negative for blurred vision and double vision.  Respiratory:  Negative for cough, sputum production, shortness of breath and stridor.   Cardiovascular:  Negative for chest pain and palpitations.  Gastrointestinal:  Negative for abdominal pain, heartburn, nausea and vomiting.  Genitourinary:  Negative for dysuria, frequency and urgency.  Musculoskeletal:  Negative for back pain, joint pain and neck pain.  Skin:  Negative for itching and rash.  Neurological:  Negative for dizziness, tingling, tremors, sensory change and headaches.  Endo/Heme/Allergies:  Negative for polydipsia. Does not bruise/bleed easily.  Psychiatric/Behavioral:  Negative for depression, hallucinations, substance abuse and suicidal ideas. The patient is not nervous/anxious and does not have insomnia.       Objective:     BP 113/68   Pulse 87   Temp 98.2 F (36.8 C) (Temporal)   Ht 5\' 4"  (1.626 m)   Wt 165 lb 2 oz (74.9 kg)   LMP 09/23/2021   SpO2 99%   BMI 28.34 kg/m  BP Readings from Last 3 Encounters:  10/03/21 113/68  09/26/21 100/69  09/23/21 124/80      Physical Exam Constitutional:      Appearance: Normal appearance.  HENT:     Head: Normocephalic and atraumatic.     Right Ear: Tympanic membrane, ear canal  and external ear normal.     Left Ear: Tympanic membrane, ear canal and external ear normal.     Nose: Nose normal.     Mouth/Throat:     Mouth: Mucous membranes are moist.  Eyes:     Pupils: Pupils are equal, round, and reactive to light.  Cardiovascular:     Rate and Rhythm: Normal rate and regular rhythm.     Pulses: Normal pulses.     Heart sounds: Normal heart sounds.  Pulmonary:     Effort: Pulmonary effort is normal.     Breath sounds: Normal breath sounds.  Abdominal:     Palpations: Abdomen is soft.  Musculoskeletal:        General: Normal range of motion.     Cervical back: Normal range of motion and neck supple.  Skin:    General: Skin is warm and dry.     Capillary Refill: Capillary refill takes less than 2 seconds.  Neurological:     Mental Status: She is alert and oriented to person, place, and time.  Psychiatric:        Mood and Affect: Mood normal.        Behavior: Behavior normal.        Thought Content: Thought content normal.        Judgment: Judgment normal.    No results found for any visits  on 10/03/21.    The 10-year ASCVD risk score (Arnett DK, et al., 2019) is: 0.8%    Assessment & Plan:   Karen Gillespie was seen today for obesity.  Diagnoses and all orders for this visit:  Overweight (BMI 25.0-29.9) PDMP reviewed, no red flags. Has lost an additional 4 lbs in the last month. Doing well with diet and exercise. Refilled for another month. Discussed will discontinue at BMI of 27 or less or at 6 months of treatment.  -     phentermine 37.5 MG capsule; Take 1 capsule (37.5 mg total) by mouth every morning.  Follow up in 1 month.  Gabriel Earing, FNP

## 2021-10-30 ENCOUNTER — Encounter: Payer: Self-pay | Admitting: Family Medicine

## 2021-10-30 ENCOUNTER — Ambulatory Visit (INDEPENDENT_AMBULATORY_CARE_PROVIDER_SITE_OTHER): Payer: 59 | Admitting: Family Medicine

## 2021-10-30 VITALS — BP 108/62 | HR 84 | Temp 98.0°F | Ht 64.0 in | Wt 164.0 lb

## 2021-10-30 DIAGNOSIS — Z30430 Encounter for insertion of intrauterine contraceptive device: Secondary | ICD-10-CM

## 2021-10-30 DIAGNOSIS — Z30431 Encounter for routine checking of intrauterine contraceptive device: Secondary | ICD-10-CM

## 2021-10-30 MED ORDER — LEVONORGESTREL 20 MCG/DAY IU IUD
1.0000 | INTRAUTERINE_SYSTEM | Freq: Once | INTRAUTERINE | Status: AC
  Administered 2021-09-26: 1 via INTRAUTERINE

## 2021-10-30 NOTE — Progress Notes (Signed)
BP 108/62   Pulse 84   Temp 98 F (36.7 C)   Ht 5\' 4"  (1.626 m)   Wt 164 lb (74.4 kg)   SpO2 98%   BMI 28.15 kg/m    Subjective:   Patient ID: , female    DOB: 1976-01-18, 46 y.o.   MRN: [id number]  HPI: Karen Gillespie is a 46 y.o. female presenting on 10/30/2021 for 11/01/2021 follow up   HPI Iud recheck Patient is coming in today for IUD recheck.  She did have 1 normal cycle and then she has been spotting a little bit recently but it has been tapering and she feels like its been doing well.  She has been sexually active with a partner and he is not complaining of it.  She has been overall very happy with that so far.  Relevant past medical, surgical, family and social history reviewed and updated as indicated. Interim medical history since our last visit reviewed. Allergies and medications reviewed and updated.  Review of Systems  Genitourinary:  Positive for menstrual problem.    Per HPI unless specifically indicated above   Allergies as of 10/30/2021       Reactions   Penicillins Shortness Of Breath, Swelling, Rash   Lamisil [terbinafine] Nausea Only   Codeine Swelling, Rash   Latex Swelling, Rash        Medication List        Accurate as of October 30, 2021 12:16 PM. If you have any questions, ask your nurse or doctor.          buPROPion 150 MG 24 hr tablet Commonly known as: Wellbutrin XL Take 1 tablet (150 mg total) by mouth daily.   Iron (Ferrous Sulfate) 325 (65 Fe) MG Tabs Take 325 mg by mouth daily.   multivitamin tablet Take 1 tablet by mouth daily.   phentermine 37.5 MG capsule Take 1 capsule (37.5 mg total) by mouth every morning.         Objective:   BP 108/62   Pulse 84   Temp 98 F (36.7 C)   Ht 5\' 4"  (1.626 m)   Wt 164 lb (74.4 kg)   SpO2 98%   BMI 28.15 kg/m   Wt Readings from Last 3 Encounters:  10/30/21 164 lb (74.4 kg)  10/03/21 165 lb 2 oz (74.9 kg)  09/26/21 169 lb (76.7 kg)    Physical  Exam Vitals and nursing note reviewed. Exam conducted with a chaperone present.  Constitutional:      Appearance: Normal appearance.  Genitourinary:    Exam position: Lithotomy position.     Cervix: No cervical motion tenderness or discharge.     Uterus: Not enlarged and not tender.      Adnexa:        Right: No tenderness or fullness.         Left: No tenderness or fullness.       Comments: IUD strings in place and visible Neurological:     Mental Status: She is alert.       Assessment & Plan:   Problem List Items Addressed This Visit   None Visit Diagnoses     Encounter for insertion of intrauterine contraceptive device (IUD)    -  Primary   Encounter for routine checking of intrauterine contraceptive device (IUD)         IUD in place and looks good, no concerns, let 10/05/21 know if she has any  Follow up  plan: Return if symptoms worsen or fail to improve.  Counseling provided for all of the vaccine components No orders of the defined types were placed in this encounter.   Arville Care, MD [facility]. 10/30/2021, 12:16 PM

## 2021-11-01 ENCOUNTER — Encounter: Payer: Self-pay | Admitting: Family Medicine

## 2021-11-01 ENCOUNTER — Ambulatory Visit (INDEPENDENT_AMBULATORY_CARE_PROVIDER_SITE_OTHER): Payer: 59 | Admitting: Family Medicine

## 2021-11-01 DIAGNOSIS — F339 Major depressive disorder, recurrent, unspecified: Secondary | ICD-10-CM | POA: Diagnosis not present

## 2021-11-01 DIAGNOSIS — E663 Overweight: Secondary | ICD-10-CM

## 2021-11-01 DIAGNOSIS — F419 Anxiety disorder, unspecified: Secondary | ICD-10-CM | POA: Diagnosis not present

## 2021-11-01 MED ORDER — BUPROPION HCL ER (XL) 150 MG PO TB24: 150.0000 mg | ORAL_TABLET | Freq: Every day | ORAL | 3 refills | Status: DC

## 2021-11-01 MED ORDER — PHENTERMINE HCL 37.5 MG PO CAPS: 37.5000 mg | ORAL_CAPSULE | ORAL | 0 refills | Status: DC

## 2021-11-01 NOTE — Progress Notes (Signed)
Established Patient Office Visit  Subjective   Patient ID: Karen Gillespie, female    DOB: 04/13/1975  Age: 46 y.o. MRN: [id number]  Chief Complaint  Patient presents with   Obesity    HPI Patient presents today for obesity follow up. She reports doing well on phentermine without side effects. She has been maintaining a well balanced diet. She continues to exercise with the wii fit for an hour a day 6 days a week. She did gain 1 lbs since her last weight check. However when she was here in the office earlier this week for an IUD check, her weight was stable at 164. She feels like this is due to some increase stress related to some unexpected home repairs. She also ran out of her wellbutrin a month ago and this has cause some increased stress as well. She is aware that this will be her last month of phentermine.      11/01/2021    8:36 AM 10/30/2021   11:16 AM 10/03/2021    8:33 AM  Depression screen PHQ 2/9  Decreased Interest 0 0 0  Down, Depressed, Hopeless 2 1 1   PHQ - 2 Score 2 1 1   Altered sleeping 0 0 0  Tired, decreased energy 0 0 0  Change in appetite 0 0 0  Feeling bad or failure about yourself  0 0 0  Trouble concentrating 0 0 0  Moving slowly or fidgety/restless 0 0 0  Suicidal thoughts 0 0 0  PHQ-9 Score 2 1 1   Difficult doing work/chores Not difficult at all Not difficult at all Not difficult at all      11/01/2021    8:37 AM 10/30/2021   11:17 AM 10/03/2021    8:34 AM 09/26/2021   12:09 PM  GAD 7 : Generalized Anxiety Score  Nervous, Anxious, on Edge 0 1 1 0  Control/stop worrying 0 1 1 1   Worry too much - different things 2 0 0 1  Trouble relaxing 0 0 0 0  Restless 0 0 0 0  Easily annoyed or irritable 0 0 0 0  Afraid - awful might happen 0 0 0 0  Total GAD 7 Score 2 2 2 2   Anxiety Difficulty Not difficult at all Not difficult at all Not difficult at all Not difficult at all     Past Medical History:  Diagnosis Date   Nail fungus    shingles       ROS Negative unless specially indicated above in HPI.    Objective:     BP 107/67   Pulse 76   Temp 98 F (36.7 C) (Temporal)   Ht 5\' 4"  (1.626 m)   Wt 165 lb (74.8 kg)   SpO2 99%   BMI 28.32 kg/m  BP Readings from Last 3 Encounters:  11/01/21 107/67  10/30/21 108/62  10/03/21 113/68      Physical Exam Vitals and nursing note reviewed.  Constitutional:      Appearance: Normal appearance.  HENT:     Head: Normocephalic and atraumatic.     Right Ear: Tympanic membrane, ear canal and external ear normal.     Left Ear: Tympanic membrane, ear canal and external ear normal.     Nose: Nose normal.     Mouth/Throat:     Mouth: Mucous membranes are moist.  Eyes:     Pupils: Pupils are equal, round, and reactive to light.  Cardiovascular:     Rate and Rhythm: Normal  rate and regular rhythm.     Pulses: Normal pulses.     Heart sounds: Normal heart sounds.  Pulmonary:     Effort: Pulmonary effort is normal.     Breath sounds: Normal breath sounds.  Abdominal:     Palpations: Abdomen is soft.  Musculoskeletal:        General: Normal range of motion.     Cervical back: Normal range of motion and neck supple.  Skin:    General: Skin is warm and dry.     Capillary Refill: Capillary refill takes less than 2 seconds.  Neurological:     Mental Status: She is alert and oriented to person, place, and time.  Psychiatric:        Mood and Affect: Mood normal.        Behavior: Behavior normal.        Thought Content: Thought content normal.        Judgment: Judgment normal.    No results found for any visits on 11/01/21.    The 10-year ASCVD risk score (Arnett DK, et al., 2019) is: 0.8%    Assessment & Plan:   Karen Gillespie was seen today for obesity.  Diagnoses and all orders for this visit:  Overweight (BMI 25.0-29.9) PDMP reviewed no red flags. She did not lose weight this last month, however has been doing well overall with a total weight loss of 32 lbs.  Continue diet and exercise. Patient aware that this is the last refill of phentermine.  -     phentermine 37.5 MG capsule; Take 1 capsule (37.5 mg total) by mouth every morning.  Depression, recurrent (HCC) Anxiety Well controlled on current regimen. Refills provided.  -     buPROPion (WELLBUTRIN XL) 150 MG 24 hr tablet; Take 1 tablet (150 mg total) by mouth daily.  Return in about 3 months (around 02/01/2022) for CPE.   The patient indicates understanding of these issues and agrees with the plan.  Gabriel Earing, FNP

## 2021-11-08 ENCOUNTER — Other Ambulatory Visit: Payer: Self-pay | Admitting: Family Medicine

## 2021-11-08 DIAGNOSIS — Z1231 Encounter for screening mammogram for malignant neoplasm of breast: Secondary | ICD-10-CM

## 2021-11-13 ENCOUNTER — Inpatient Hospital Stay: Admission: RE | Admit: 2021-11-13 | Payer: Self-pay | Source: Ambulatory Visit

## 2021-11-13 ENCOUNTER — Ambulatory Visit
Admission: RE | Admit: 2021-11-13 | Discharge: 2021-11-13 | Disposition: A | Discharge status: Home / Self Care | Payer: 59 | Source: Ambulatory Visit | Attending: Family Medicine | Admitting: Family Medicine

## 2021-11-13 DIAGNOSIS — Z1231 Encounter for screening mammogram for malignant neoplasm of breast: Secondary | ICD-10-CM | POA: Diagnosis not present

## 2022-02-05 ENCOUNTER — Encounter: Payer: 59 | Admitting: Family Medicine

## 2022-02-05 ENCOUNTER — Encounter: Payer: Self-pay | Admitting: Family Medicine

## 2022-04-14 ENCOUNTER — Encounter: Payer: Self-pay | Admitting: Family Medicine

## 2022-04-14 ENCOUNTER — Ambulatory Visit (INDEPENDENT_AMBULATORY_CARE_PROVIDER_SITE_OTHER): Payer: 59 | Admitting: Family Medicine

## 2022-04-14 VITALS — BP 108/64 | HR 81 | Temp 98.0°F | Ht 64.0 in | Wt 176.4 lb

## 2022-04-14 DIAGNOSIS — J069 Acute upper respiratory infection, unspecified: Secondary | ICD-10-CM

## 2022-04-14 DIAGNOSIS — J101 Influenza due to other identified influenza virus with other respiratory manifestations: Secondary | ICD-10-CM

## 2022-04-14 LAB — VERITOR FLU A/B WAIVED
Influenza A: NEGATIVE
Influenza B: POSITIVE — AB

## 2022-04-14 MED ORDER — CHLORPHEN-PE-ACETAMINOPHEN 4-10-325 MG PO TABS: 1.0000 | ORAL_TABLET | Freq: Four times a day (QID) | ORAL | 0 refills | Status: DC | PRN

## 2022-04-14 MED ORDER — OSELTAMIVIR PHOSPHATE 75 MG PO CAPS: 75.0000 mg | ORAL_CAPSULE | Freq: Two times a day (BID) | ORAL | 0 refills | Status: AC

## 2022-04-14 MED ORDER — BENZONATATE 100 MG PO CAPS: 100.0000 mg | ORAL_CAPSULE | Freq: Three times a day (TID) | ORAL | 0 refills | Status: DC | PRN

## 2022-04-14 NOTE — Progress Notes (Signed)
Acute Office Visit  Subjective:     Patient ID: Karen Gillespie, female    DOB: 1975/12/23, 47 y.o.   MRN: [id number]  Chief Complaint  Patient presents with   Cough   Nasal Congestion    URI  This is a new problem. The current episode started yesterday. There has been no fever. Associated symptoms include congestion, coughing, sinus pain and a sore throat. Pertinent negatives include no abdominal pain, chest pain, diarrhea, ear pain, headaches, joint pain, joint swelling, nausea, vomiting or wheezing. She has tried decongestant for the symptoms. The treatment provided mild relief.   Her niece has Covid. She was around her 2 days ago. Other close exposure to family who has been sick and had negative Covid tests.   Review of Systems  HENT:  Positive for congestion, sinus pain and sore throat. Negative for ear pain.   Respiratory:  Positive for cough. Negative for wheezing.   Cardiovascular:  Negative for chest pain.  Gastrointestinal:  Negative for abdominal pain, diarrhea, nausea and vomiting.  Musculoskeletal:  Negative for joint pain.  Neurological:  Negative for headaches.        Objective:    BP 108/64   Pulse 81   Temp 98 F (36.7 C) (Temporal)   Ht 5\' 4"  (1.626 m)   Wt 176 lb 6 oz (80 kg)   SpO2 98%   BMI 30.27 kg/m    Physical Exam Vitals and nursing note reviewed.  Constitutional:      General: She is not in acute distress.    Appearance: Normal appearance. She is not ill-appearing, toxic-appearing or diaphoretic.  HENT:     Head: Normocephalic and atraumatic.     Right Ear: Tympanic membrane, ear canal and external ear normal.     Left Ear: Tympanic membrane, ear canal and external ear normal.     Nose: Congestion present.     Mouth/Throat:     Mouth: Mucous membranes are moist.     Pharynx: Oropharynx is clear. No oropharyngeal exudate or posterior oropharyngeal erythema.  Eyes:     General:        Right eye: No discharge.        Left eye: No  discharge.     Conjunctiva/sclera: Conjunctivae normal.  Cardiovascular:     Rate and Rhythm: Normal rate and regular rhythm.     Heart sounds: Normal heart sounds. No murmur heard. Pulmonary:     Effort: Pulmonary effort is normal. No respiratory distress.     Breath sounds: Normal breath sounds.  Abdominal:     General: Bowel sounds are normal. There is no distension.     Palpations: Abdomen is soft.     Tenderness: There is no abdominal tenderness. There is no guarding or rebound.  Musculoskeletal:     Cervical back: Neck supple. No rigidity.     Right lower leg: No edema.     Left lower leg: No edema.  Lymphadenopathy:     Cervical: No cervical adenopathy.  Skin:    General: Skin is warm and dry.  Neurological:     General: No focal deficit present.     Mental Status: She is alert and oriented to person, place, and time.  Psychiatric:        Mood and Affect: Mood normal.        Behavior: Behavior normal.     No results found for any visits on 04/14/22.      Assessment & Plan:  Karen Gillespie was seen today for cough and nasal congestion.  Diagnoses and all orders for this visit:  Viral URI with cough Influenza B Rapid flu B positive. Tamiflu as below. Discussed symptomatic care and return precautions.  -     Chlorphen-PE-Acetaminophen 4-10-325 MG TABS; Take 1 tablet by mouth every 6 (six) hours as needed. -     benzonatate (TESSALON PERLES) 100 MG capsule; Take 1 capsule (100 mg total) by mouth 3 (three) times daily as needed. -     Veritor Flu A/B Waived -     oseltamivir (TAMIFLU) 75 MG capsule; Take 1 capsule (75 mg total) by mouth 2 (two) times daily for 5 days.   Return for CPE.  The patient indicates understanding of these issues and agrees with the plan.  Gwenlyn Perking, FNP

## 2022-07-21 ENCOUNTER — Encounter: Payer: Self-pay | Admitting: Family Medicine

## 2022-07-21 ENCOUNTER — Ambulatory Visit (INDEPENDENT_AMBULATORY_CARE_PROVIDER_SITE_OTHER): Payer: 59 | Admitting: Family Medicine

## 2022-07-21 VITALS — BP 116/65 | HR 66 | Temp 98.1°F | Ht 64.0 in | Wt 187.5 lb

## 2022-07-21 DIAGNOSIS — Z6832 Body mass index (BMI) 32.0-32.9, adult: Secondary | ICD-10-CM | POA: Diagnosis not present

## 2022-07-21 DIAGNOSIS — E669 Obesity, unspecified: Secondary | ICD-10-CM

## 2022-07-21 NOTE — Patient Instructions (Addendum)
Call your insurance and ask about coverage for weight loss medications (Wegovy, Zepbound etc). We will complete a prior authorization if medications are covered.

## 2022-07-21 NOTE — Progress Notes (Signed)
   Acute Office Visit  Subjective:     Patient ID: Karen Gillespie, female    DOB: 09-Dec-1975, 47 y.o.   MRN: [id number]  Chief Complaint  Patient presents with   Obesity    HPI Patient is in today for obesity. She has gained back 20 lbs since last August. She reports that her diet has not been the best. She has been walking daily for a mile. She has done well on phentermine in the past but gains weight back after stopping. She reports that she has been under increased stress lately and that her family isn't interested in a healthy diet and exercise and doesn't help to motivate her.   ROS As per HPI.      Objective:    BP 116/65   Pulse 66   Temp 98.1 F (36.7 C) (Temporal)   Ht 5\' 4"  (1.626 m)   Wt 187 lb 8 oz (85 kg)   SpO2 98%   BMI 32.18 kg/m  Wt Readings from Last 3 Encounters:  07/21/22 187 lb 8 oz (85 kg)  04/14/22 176 lb 6 oz (80 kg)  11/01/21 165 lb (74.8 kg)      Physical Exam Vitals and nursing note reviewed.  Constitutional:      General: She is not in acute distress.    Appearance: She is obese. She is not ill-appearing, toxic-appearing or diaphoretic.  Cardiovascular:     Rate and Rhythm: Normal rate and regular rhythm.     Heart sounds: Normal heart sounds. No murmur heard. Pulmonary:     Effort: Pulmonary effort is normal. No respiratory distress.     Breath sounds: Normal breath sounds.  Musculoskeletal:     Cervical back: Neck supple. No rigidity.     Right lower leg: No edema.     Left lower leg: No edema.  Skin:    General: Skin is warm and dry.  Neurological:     General: No focal deficit present.     Mental Status: She is alert and oriented to person, place, and time.  Psychiatric:        Mood and Affect: Mood normal.        Thought Content: Thought content normal.        Judgment: Judgment normal.     No results found for any visits on 07/21/22.      Assessment & Plan:   Leidy was seen today for obesity.  Diagnoses  and all orders for this visit:  Obesity (BMI 30-39.9) She will call her insurance regarding coverage for weight loss medications. She has failed phentermine multiple times. Continue diet and exercise. Also discussed referral to healthy weight and wellness. She would like to see what options are covered before referral.    Return for schedule CPE.  The patient indicates understanding of these issues and agrees with the plan.   Gabriel Earing, FNP

## 2022-11-13 ENCOUNTER — Encounter: Payer: Self-pay | Admitting: Family Medicine

## 2022-11-13 ENCOUNTER — Ambulatory Visit (INDEPENDENT_AMBULATORY_CARE_PROVIDER_SITE_OTHER): Payer: 59 | Admitting: Family Medicine

## 2022-11-13 ENCOUNTER — Telehealth: Payer: Self-pay | Admitting: Pharmacy Technician

## 2022-11-13 ENCOUNTER — Other Ambulatory Visit (HOSPITAL_COMMUNITY): Payer: Self-pay

## 2022-11-13 DIAGNOSIS — E559 Vitamin D deficiency, unspecified: Secondary | ICD-10-CM | POA: Diagnosis not present

## 2022-11-13 DIAGNOSIS — E7800 Pure hypercholesterolemia, unspecified: Secondary | ICD-10-CM | POA: Diagnosis not present

## 2022-11-13 DIAGNOSIS — E6609 Other obesity due to excess calories: Secondary | ICD-10-CM

## 2022-11-13 DIAGNOSIS — D500 Iron deficiency anemia secondary to blood loss (chronic): Secondary | ICD-10-CM | POA: Diagnosis not present

## 2022-11-13 DIAGNOSIS — Z0001 Encounter for general adult medical examination with abnormal findings: Secondary | ICD-10-CM | POA: Diagnosis not present

## 2022-11-13 DIAGNOSIS — Z6833 Body mass index (BMI) 33.0-33.9, adult: Secondary | ICD-10-CM

## 2022-11-13 DIAGNOSIS — Z0000 Encounter for general adult medical examination without abnormal findings: Secondary | ICD-10-CM

## 2022-11-13 MED ORDER — SEMAGLUTIDE-WEIGHT MANAGEMENT 1.7 MG/0.75ML ~~LOC~~ SOAJ: 1.7000 mg | SUBCUTANEOUS | 0 refills | Status: AC

## 2022-11-13 MED ORDER — SEMAGLUTIDE-WEIGHT MANAGEMENT 1 MG/0.5ML ~~LOC~~ SOAJ: 1.0000 mg | SUBCUTANEOUS | 0 refills | Status: AC

## 2022-11-13 MED ORDER — SEMAGLUTIDE-WEIGHT MANAGEMENT 0.25 MG/0.5ML ~~LOC~~ SOAJ: 0.2500 mg | SUBCUTANEOUS | 0 refills | Status: AC

## 2022-11-13 MED ORDER — SEMAGLUTIDE-WEIGHT MANAGEMENT 0.5 MG/0.5ML ~~LOC~~ SOAJ: 0.5000 mg | SUBCUTANEOUS | 0 refills | Status: AC

## 2022-11-13 MED ORDER — SEMAGLUTIDE-WEIGHT MANAGEMENT 2.4 MG/0.75ML ~~LOC~~ SOAJ: 2.4000 mg | SUBCUTANEOUS | 0 refills | Status: AC

## 2022-11-13 NOTE — Progress Notes (Signed)
Complete physical exam  Patient: Karen Gillespie   DOB: 12-25-1975   47 y.o. Female  MRN: [id number]  Subjective:    Chief Complaint  Patient presents with   Annual Exam    Karen Gillespie is a 47 y.o. female who presents today for a complete physical exam. She reports consuming a general diet. The patient does not participate in regular exercise at present. She generally feels well. She reports sleeping fairly well. She does have additional problems to discuss today.   She would like to discuss options for weight loss. She has tried and failed phentermine in the past. She is interested in [place]. She has called her insurance and there is coverage with wegovy with stipulations.   Most recent fall risk assessment:    11/13/2022   10:10 AM  Fall Risk   Falls in the past year? 0     Most recent depression screenings:    11/13/2022   10:07 AM 07/21/2022   10:50 AM 04/14/2022    2:45 PM  Depression screen PHQ 2/9  Decreased Interest 0 0 1  Down, Depressed, Hopeless 0 1 0  PHQ - 2 Score 0 1 1  Altered sleeping 1 1 0  Tired, decreased energy 1 0 0  Change in appetite 0 0 1  Feeling bad or failure about yourself  1 1 1   Trouble concentrating 0 0 0  Moving slowly or fidgety/restless 0 0 0  Suicidal thoughts 0 0 0  PHQ-9 Score 3 3 3   Difficult doing work/chores Not difficult at all Not difficult at all Not difficult at all      11/13/2022   10:10 AM 07/21/2022   10:51 AM 04/14/2022    2:46 PM 11/01/2021    8:37 AM  GAD 7 : Generalized Anxiety Score  Nervous, Anxious, on Edge 0 0 0 0  Control/stop worrying 1 1 1  0  Worry too much - different things 1 1 1 2   Trouble relaxing 0 0 0 0  Restless 0 0 0 0  Easily annoyed or irritable 0 0 0 0  Afraid - awful might happen 0 0 0 0  Total GAD 7 Score 2 2 2 2   Anxiety Difficulty Not difficult at all Not difficult at all Not difficult at all Not difficult at all      Vision:Not within last year  and Dental: No current dental  problems and No regular dental care   Past Medical History:  Diagnosis Date   Nail fungus    shingles      Patient Care Team: Gabriel Earing, FNP as PCP - General (Family Medicine)   Outpatient Medications Prior to Visit  Medication Sig   levonorgestrel (MIRENA) 20 MCG/DAY IUD 1 each by Intrauterine route once.   Multiple Vitamin (MULTIVITAMIN) tablet Take 1 tablet by mouth daily.   No facility-administered medications prior to visit.    ROS As per HPI.      Objective:     BP 117/63   Pulse 72   Temp 98 F (36.7 C) (Temporal)   Ht 5\' 4"  (1.626 m)   Wt 194 lb 2 oz (88.1 kg)   SpO2 96%   BMI 33.32 kg/m  Wt Readings from Last 3 Encounters:  11/13/22 194 lb 2 oz (88.1 kg)  07/21/22 187 lb 8 oz (85 kg)  04/14/22 176 lb 6 oz (80 kg)      Physical Exam Vitals and nursing note reviewed.  Constitutional:  General: She is not in acute distress.    Appearance: She is obese. She is not ill-appearing, toxic-appearing or diaphoretic.  HENT:     Head: Normocephalic.     Right Ear: Tympanic membrane, ear canal and external ear normal.     Left Ear: Tympanic membrane, ear canal and external ear normal.     Nose: Nose normal.     Mouth/Throat:     Mouth: Mucous membranes are dry.     Pharynx: Oropharynx is clear.  Eyes:     Extraocular Movements: Extraocular movements intact.     Conjunctiva/sclera: Conjunctivae normal.     Pupils: Pupils are equal, round, and reactive to light.  Cardiovascular:     Rate and Rhythm: Normal rate and regular rhythm.     Pulses: Normal pulses.     Heart sounds: Normal heart sounds. No murmur heard.    No friction rub. No gallop.  Pulmonary:     Effort: Pulmonary effort is normal.     Breath sounds: Normal breath sounds.  Abdominal:     General: Bowel sounds are normal. There is no distension.     Palpations: Abdomen is soft. There is no mass.     Tenderness: There is no abdominal tenderness. There is no guarding.   Musculoskeletal:        General: No swelling or tenderness. Normal range of motion.     Cervical back: Normal range of motion and neck supple. No tenderness.     Right lower leg: No edema.     Left lower leg: No edema.  Skin:    General: Skin is warm and dry.     Capillary Refill: Capillary refill takes less than 2 seconds.     Findings: No lesion or rash.  Neurological:     General: No focal deficit present.     Mental Status: She is alert and oriented to person, place, and time.     Cranial Nerves: No cranial nerve deficit.     Motor: No weakness.     Gait: Gait normal.  Psychiatric:        Mood and Affect: Mood normal.        Behavior: Behavior normal.        Thought Content: Thought content normal.        Judgment: Judgment normal.      No results found for any visits on 11/13/22.     Assessment & Plan:    Routine Health Maintenance and Physical Exam  Karen Gillespie was seen today for annual exam.  Diagnoses and all orders for this visit:  Routine general medical examination at a health care facility  Iron deficiency anemia due to chronic blood loss Not on supplement. Labs pending.  -     Anemia Profile B  Pure hypercholesterolemia Diet, exercise, weight loss.  -     Lipid panel  Class 1 obesity due to excess calories with serious comorbidity and body mass index (BMI) of 33.0 to 33.9 in adult Patient's BMI is >30 mg/m2.  Patient's current BMI is Body mass index is 33.32 kg/m.Marland Kitchen  Patient is currently enrolled in a healthy eating plan along with encouraged exercise.  Patient has failed phentermine. Patient does not have a personal or family history of medullary thyroid carcinoma (MTC) or Multiple Endocrine Neoplasia syndrome type 2 (MEN 2). -     CMP14+EGFR -     Lipid panel -     TSH -     Semaglutide-Weight Management  0.25 MG/0.5ML SOAJ; Inject 0.25 mg into the skin once a week for 28 days. -     Semaglutide-Weight Management 0.5 MG/0.5ML SOAJ; Inject 0.5 mg into  the skin once a week for 28 days. -     Semaglutide-Weight Management 1 MG/0.5ML SOAJ; Inject 1 mg into the skin once a week for 28 days. -     Semaglutide-Weight Management 1.7 MG/0.75ML SOAJ; Inject 1.7 mg into the skin once a week for 28 days. -     Semaglutide-Weight Management 2.4 MG/0.75ML SOAJ; Inject 2.4 mg into the skin once a week for 28 days.  Vitamin D deficiency Not on supplement. Labs pending.  -     VITAMIN D 25 Hydroxy (Vit-D Deficiency, Fractures)    Immunization History  Administered Date(s) Administered   Influenza,inj,Quad PF,6+ Mos 12/20/2012   Pneumococcal Polysaccharide-23 12/20/2012   Tdap 09/21/2020    Health Maintenance  Topic Date Due   Colonoscopy  Never done   INFLUENZA VACCINE  06/08/2023 (Originally 10/09/2022)   COVID-19 Vaccine (1 - 2023-24 season) 11/29/2023 (Originally 11/09/2022)   MAMMOGRAM  11/14/2022   PAP SMEAR-Modifier  09/22/2023   DTaP/Tdap/Td (2 - Td or Tdap) 09/22/2030   Hepatitis C Screening  Completed   HIV Screening  Completed   HPV VACCINES  Aged Out    Discussed health benefits of physical activity, and encouraged her to engage in regular exercise appropriate for her age and condition.  Problem List Items Addressed This Visit       Other   Iron deficiency anemia due to chronic blood loss   Relevant Orders   Anemia Profile B   Pure hypercholesterolemia   Relevant Orders   Lipid panel   Other Visit Diagnoses     Routine general medical examination at a health care facility       Class 1 obesity due to excess calories with serious comorbidity and body mass index (BMI) of 33.0 to 33.9 in adult       Relevant Medications   Semaglutide-Weight Management 0.25 MG/0.5ML SOAJ   Semaglutide-Weight Management 0.5 MG/0.5ML SOAJ (Start on 12/12/2022)   Semaglutide-Weight Management 1 MG/0.5ML SOAJ (Start on 01/10/2023)   Semaglutide-Weight Management 1.7 MG/0.75ML SOAJ (Start on 02/08/2023)   Semaglutide-Weight Management 2.4 MG/0.75ML  SOAJ (Start on 03/09/2023)   Other Relevant Orders   CMP14+EGFR   Lipid panel   TSH   Vitamin D deficiency       Relevant Orders   VITAMIN D 25 Hydroxy (Vit-D Deficiency, Fractures)      Return in 1 year (on 11/13/2023).   The patient indicates understanding of these issues and agrees with the plan.   Gabriel Earing, FNP

## 2022-11-13 NOTE — Patient Instructions (Signed)

## 2022-11-13 NOTE — Telephone Encounter (Signed)
Pharmacy Patient Advocate Encounter   Received notification from CoverMyMeds that prior authorization for wegovy is required/requested.   Insurance verification completed.   The patient is insured through  [employer]  .   Per test claim: PA required; PA started via CoverMyMeds. KEY [id number] . Waiting for clinical questions to populate.

## 2022-11-13 NOTE — Telephone Encounter (Signed)
Pharmacy Patient Advocate Encounter   Received notification from CoverMyMeds that prior authorization for wegovy is required/requested.   Insurance verification completed.   The patient is insured through  [employer]     Per test claim: PA required; PA submitted to [employer] via CoverMyMeds Key/confirmation #/EOC [id number] Status is pending

## 2022-11-14 LAB — ANEMIA PROFILE B
Basophils Absolute: 0 10*3/uL (ref 0.0–0.2)
Basos: 1 %
EOS (ABSOLUTE): 0.2 10*3/uL (ref 0.0–0.4)
Eos: 4 %
Ferritin: 144 ng/mL (ref 15–150)
Folate: 13.4 ng/mL (ref >3.0)
Hematocrit: 39.6 % (ref 34.0–46.6)
Hemoglobin: 13.7 g/dL (ref 11.1–15.9)
Immature Grans (Abs): 0 10*3/uL (ref 0.0–0.1)
Immature Granulocytes: 0 %
Iron Saturation: 27 % (ref 15–55)
Iron: 101 ug/dL (ref 27–159)
Lymphocytes Absolute: 1.8 10*3/uL (ref 0.7–3.1)
Lymphs: 40 %
MCH: 30 pg (ref 26.6–33.0)
MCHC: 34.6 g/dL (ref 31.5–35.7)
MCV: 87 fL (ref 79–97)
Monocytes Absolute: 0.4 10*3/uL (ref 0.1–0.9)
Monocytes: 9 %
Neutrophils Absolute: 2 10*3/uL (ref 1.4–7.0)
Neutrophils: 46 %
Platelets: 227 10*3/uL (ref 150–450)
RBC: 4.56 x10E6/uL (ref 3.77–5.28)
RDW: 12.7 % (ref 11.7–15.4)
Retic Ct Pct: 1.7 % (ref 0.6–2.6)
Total Iron Binding Capacity: 380 ug/dL (ref 250–450)
UIBC: 279 ug/dL (ref 131–425)
Vitamin B-12: 427 pg/mL (ref 232–1245)
WBC: 4.4 10*3/uL (ref 3.4–10.8)

## 2022-11-14 LAB — LIPID PANEL
Chol/HDL Ratio: 4.6 ratio — ABNORMAL HIGH (ref 0.0–4.4)
Cholesterol, Total: 195 mg/dL (ref 100–199)
HDL: 42 mg/dL (ref >39)
LDL Chol Calc (NIH): 128 mg/dL — ABNORMAL HIGH (ref 0–99)
Triglycerides: 140 mg/dL (ref 0–149)
VLDL Cholesterol Cal: 25 mg/dL (ref 5–40)

## 2022-11-14 LAB — CMP14+EGFR
ALT: 23 IU/L (ref 0–32)
AST: 22 IU/L (ref 0–40)
Albumin: 4.5 g/dL (ref 3.9–4.9)
Alkaline Phosphatase: 70 IU/L (ref 44–121)
BUN/Creatinine Ratio: 13 (ref 9–23)
BUN: 11 mg/dL (ref 6–24)
Bilirubin Total: 0.6 mg/dL (ref 0.0–1.2)
CO2: 23 mmol/L (ref 20–29)
Calcium: 9.8 mg/dL (ref 8.7–10.2)
Chloride: 102 mmol/L (ref 96–106)
Creatinine, Ser: 0.86 mg/dL (ref 0.57–1.00)
Globulin, Total: 2.3 g/dL (ref 1.5–4.5)
Glucose: 95 mg/dL (ref 70–99)
Potassium: 4 mmol/L (ref 3.5–5.2)
Sodium: 138 mmol/L (ref 134–144)
Total Protein: 6.8 g/dL (ref 6.0–8.5)
eGFR: 84 mL/min/{1.73_m2} (ref >59)

## 2022-11-14 LAB — TSH: TSH: 1.44 u[IU]/mL (ref 0.450–4.500)

## 2022-11-14 LAB — VITAMIN D 25 HYDROXY (VIT D DEFICIENCY, FRACTURES): Vit D, 25-Hydroxy: 34.4 ng/mL (ref 30.0–100.0)

## 2022-11-15 ENCOUNTER — Other Ambulatory Visit (HOSPITAL_COMMUNITY): Payer: Self-pay

## 2022-11-17 NOTE — Telephone Encounter (Signed)
Pharmacy Patient Advocate Encounter  Received notification from [employer] that Prior Authorization for wegovy has been DENIED.  See denial reason below. No denial letter attached in CMM. Will attache denial letter to Media tab once received.   PA #/Case ID/Reference #: [id number] EG

## 2022-11-17 NOTE — Telephone Encounter (Signed)
Pt aware wegovy not a covered benefit.

## 2024-03-30 NOTE — Progress Notes (Signed)
 Karen Gillespie                                          MRN: [id number]   03/30/2024   The [facility] Quality Team Specialist reviewed this patient medical record for the purposes of chart review for care gap closure. The following were reviewed: chart review for care gap closure-colorectal cancer screening.    [facility] Quality Team
